# Patient Record
Sex: Male | Born: 1955 | Race: Black or African American | Hispanic: No | Marital: Married | Smoking: Current every day smoker
Health system: Southern US, Community
[De-identification: ages and names within clinical notes are randomized; demographics above are authoritative.]

## PROBLEM LIST (undated history)

## (undated) DIAGNOSIS — K759 Inflammatory liver disease, unspecified: Secondary | ICD-10-CM

## (undated) DIAGNOSIS — F329 Major depressive disorder, single episode, unspecified: Secondary | ICD-10-CM

## (undated) DIAGNOSIS — M1990 Unspecified osteoarthritis, unspecified site: Secondary | ICD-10-CM

## (undated) DIAGNOSIS — I10 Essential (primary) hypertension: Secondary | ICD-10-CM

## (undated) DIAGNOSIS — F32A Depression, unspecified: Secondary | ICD-10-CM

## (undated) HISTORY — PX: JOINT REPLACEMENT: SHX530

## (undated) HISTORY — PX: OTHER SURGICAL HISTORY: SHX169

---

## 1999-02-11 ENCOUNTER — Encounter: Payer: Self-pay | Admitting: Urology

## 1999-02-11 ENCOUNTER — Encounter: Admission: RE | Admit: 1999-02-11 | Discharge: 1999-02-11 | Payer: Self-pay | Admitting: Urology

## 1999-11-11 ENCOUNTER — Ambulatory Visit (HOSPITAL_BASED_OUTPATIENT_CLINIC_OR_DEPARTMENT_OTHER): Admission: RE | Admit: 1999-11-11 | Discharge: 1999-11-11 | Payer: Self-pay | Admitting: Urology

## 2003-02-01 ENCOUNTER — Ambulatory Visit (HOSPITAL_COMMUNITY): Admission: RE | Admit: 2003-02-01 | Discharge: 2003-02-01 | Payer: Self-pay | Admitting: Internal Medicine

## 2003-08-20 ENCOUNTER — Emergency Department (HOSPITAL_COMMUNITY): Admission: EM | Admit: 2003-08-20 | Discharge: 2003-08-20 | Payer: Self-pay | Admitting: Family Medicine

## 2003-08-22 ENCOUNTER — Emergency Department (HOSPITAL_COMMUNITY): Admission: EM | Admit: 2003-08-22 | Discharge: 2003-08-22 | Payer: Self-pay | Admitting: Family Medicine

## 2003-08-24 ENCOUNTER — Emergency Department (HOSPITAL_COMMUNITY): Admission: EM | Admit: 2003-08-24 | Discharge: 2003-08-24 | Payer: Self-pay | Admitting: Family Medicine

## 2003-09-11 ENCOUNTER — Emergency Department (HOSPITAL_COMMUNITY): Admission: EM | Admit: 2003-09-11 | Discharge: 2003-09-11 | Payer: Self-pay | Admitting: *Deleted

## 2003-11-24 ENCOUNTER — Encounter: Admission: RE | Admit: 2003-11-24 | Discharge: 2003-11-24 | Payer: Self-pay | Admitting: Family Medicine

## 2003-12-08 ENCOUNTER — Encounter: Admission: RE | Admit: 2003-12-08 | Discharge: 2003-12-08 | Payer: Self-pay | Admitting: Family Medicine

## 2004-10-29 ENCOUNTER — Emergency Department (HOSPITAL_COMMUNITY): Admission: EM | Admit: 2004-10-29 | Discharge: 2004-10-29 | Payer: Self-pay | Admitting: Family Medicine

## 2004-11-21 ENCOUNTER — Emergency Department (HOSPITAL_COMMUNITY): Admission: EM | Admit: 2004-11-21 | Discharge: 2004-11-21 | Payer: Self-pay | Admitting: Family Medicine

## 2005-02-21 ENCOUNTER — Emergency Department (HOSPITAL_COMMUNITY): Admission: EM | Admit: 2005-02-21 | Discharge: 2005-02-21 | Payer: Self-pay | Admitting: Emergency Medicine

## 2005-09-30 ENCOUNTER — Ambulatory Visit (HOSPITAL_COMMUNITY): Admission: AD | Admit: 2005-09-30 | Discharge: 2005-10-01 | Payer: Self-pay | Admitting: Surgery

## 2006-03-02 ENCOUNTER — Encounter: Admission: RE | Admit: 2006-03-02 | Discharge: 2006-03-02 | Payer: Self-pay | Admitting: Family Medicine

## 2007-02-02 ENCOUNTER — Inpatient Hospital Stay (HOSPITAL_COMMUNITY): Admission: RE | Admit: 2007-02-02 | Discharge: 2007-02-04 | Payer: Self-pay | Admitting: Orthopedic Surgery

## 2008-12-30 ENCOUNTER — Emergency Department (HOSPITAL_COMMUNITY): Admission: EM | Admit: 2008-12-30 | Discharge: 2008-12-30 | Payer: Self-pay | Admitting: Family Medicine

## 2009-12-21 ENCOUNTER — Emergency Department (HOSPITAL_COMMUNITY): Admission: EM | Admit: 2009-12-21 | Discharge: 2009-12-21 | Payer: Self-pay | Admitting: Family Medicine

## 2010-06-06 LAB — GC/CHLAMYDIA PROBE AMP, GENITAL
Chlamydia, DNA Probe: NEGATIVE
GC Probe Amp, Genital: POSITIVE — AB

## 2010-08-06 NOTE — Op Note (Signed)
NAME:  Franklin Pittman, Franklin Pittman NO.:  [id number]   MEDICAL RECORD NO.:  [id number]          PATIENT TYPE:  INP   LOCATION:  2899                         FACILITY:  [facility]   PHYSICIAN:  Lenard Galloway. Mortenson, M.D.DATE OF BIRTH:  04-Mar-1956   DATE OF PROCEDURE:  02/02/2007  DATE OF DISCHARGE:                               OPERATIVE REPORT   PREOPERATIVE DIAGNOSIS:  Severe osteoarthritis, right knee.   POSTOPERATIVE DIAGNOSIS:  Severe osteoarthritis, right knee.   OPERATION:  Total knee replacement on the right using a large right  cemented femoral component with an MBT keeled tibial tray, cemented size  5 with a large metal back patella cemented and a 12.5 poly insert large.   SURGEON:  Lenard Galloway. Chaney Malling, M.D.   ASSISTANT:  Arlys John D. Petrarca, P.A.-C.   ANESTHESIA:  General.   PROCEDURE:  The patient placed on the operative table in supine position  with the pneumatic tourniquet about the right upper thigh.  The entire  right lower extremity prepped with DuraPrep, draped out in the usual  manner.  Leg was wrapped out with an Esmarch, tourniquet was elevated.  A midline incision made starting above the patella carried down to the  tibial tubercle.  Skin edges were retracted.  Bleeders were coagulated.  A long median parapatellar incision was then made the patella was  everted.  An extensive synovectomy was then done.  Knee was flexed and  the cruciate ligaments were released and both medial and lateral  meniscus were totally excised.  There is a fair amount of varus  deformity and the medial capsule was stripped off with a Cobb.  At this  point two Schanz pins were placed in the distal femur and the proximal  tibia in a standard manner and tibial arrays and the femoral arrays were  attached.  Then computer registration started.  First the femoral head  center was registered and the tibial mechanical axis and then we defined  the proximal tibial plateau and then defined  the distal femoral condyles  and epicondyles and articular surfaces following computer promptings in  the standard manner.  Tibial planning was then done.  Tibial resection  settings were registered and then tibial resection was done following  computer promptings.  The resection made of proximal tibia.  This was  then cleaned up slightly and the confirmation guide was used and perfect  cut was made.  Soft tissue balancing was then done following computer  promptings with a tension device both flexion/extension so that the  flexion/extension gaps were essentially equal.  Once this was completed,  femoral implant planning was registered then femoral resection following  computer promptings done.  First the anterior femoral resection guide  for anterior-posterior cut was locked in place following computer  promptings and distal end of the anterior and posterior part of the  femoral condyles were amputated.  Next femoral guide resection #2 was  placed over the anterior cut section and following computer promptings,  locked in place and the distal end of femur was resected.  This was  tested with the spacer block  and there was good balancing of the medial  and lateral collateral ligaments, both in flexion and extension.  Finally, the final femoral guide was placed over the distal end of femur  locked in position with fixation pins and prepped with chamfer cuts were  made and drill holes were made.  The femoral guide was then removed.  All loose bone was removed.  The keel was placed behind the tibia and  tibia subluxed anteriorly and a small single spike was placed over  lateral side of knee.  Various size tibial components were selected for  trials and the size 5 fit very nicely.  This was locked in position with  locking pins.  The tibial tower was put in place and a hole was placed  in the proximal tibia.  The flange cutting keel was then driven down  into the tibia.  This gave excellent  stability.  The locking pins were  removed.  A trial poly was inserted and the femoral trial was then  inserted, knee put through full range of motion.  There was full  flexion, full extension.  Excellent realignment following computer  promptings.  The soft tissues were balanced very nicely in both flexion  and extension.  I was very pleased with the position using 12.5 spacer.  Next the patella cutting guide was placed in posterior aspect of the  patella.  Posterior aspect patella was amputated and then drill hole  guide was placed on the cut surface of patella and drill holes were  made.  The patellar trial was then inserted and the knee articulated and  put through full range of motion.  There was excellent flexion  extension, no rotation of the tibial plateau, no __________, AP drawer  was negative even with the capsule closed.  The patella tracked very  nicely in midline with no tilt.  At this point all the trial components  then removed.  Pulsating lavage was used to remove all debris.  Once  this accomplished to my satisfaction, glue was mixed and each of the  components were implanted first starting with the tibia and then the  femur and then the patella in the standard manner.  Excess glue was  removed in the standard manner.  Before the glue had hardened, the  tibial trial poly was inserted and the knee held in extension as the  glue cured.  Excess glue was removed using small cement removers and  curettes.  Once the glue had hardened up, the small osteotome was used  to remove extra glue.  The knee was put through full range of motion.  There was wonderful stability in range of motion.  The trial tibial poly  was removed and final tibial poly was selected and snap fitted in place.  The knee was articulated and again put through full range of motion.  Just prior to insertion of the final poly, the tourniquet was dropped  and good hemostasis was achieved.  No arterial pumping was  seen  throughout.  The knee was irrigated again and then the final tibial poly  was inserted.  The long medial parapatellar incision was closed with  interrupted Tycron sutures.  Vicryl used to close subcutaneous tissue  and stainless steel staples used to close the skin.  Technically this  procedure went extremely well.  I was very pleased with the surgical  outcome and the resulting alignment and range of motion.  Drains none.  Complications none.  ______________________________  Lenard Galloway Chaney Malling, M.D.     RAM/MEDQ  D:  02/02/2007  T:  02/03/2007  Job:  [id number]

## 2010-08-09 NOTE — Op Note (Signed)
[facility]  Patient:    Franklin Pittman, Franklin Pittman                      MRN: [id number] Proc. Date: 11/11/99 Adm. Date:  [id number] Attending:  Thermon Leyland                           Operative Report  PREOPERATIVE DIAGNOSIS:  Benign prostatic hypertrophy.  POSTOPERATIVE DIAGNOSIS:  Benign prostatic hypertrophy.  SURGEON:  Barron Alvine, M.D.  ASSISTANT:  PROCEDURE:  TUNA and TURP.  ANESTHESIA:  General.  INDICATIONS:  Mr. Hagins has had some longstanding obstructive and irritative voiding symptoms.  He has complained of considerable high density with a slow and weak stream, with some frequency and urgency.  He has had mild improvement with alpha blockers.  He was interested in a more definitive and, hopefully, more effective treatment option.  We extensively discussed the pros and cons of a TUNA procedure plus/minus a transurethral incision of his prostate.  On cystoscopy, he had a 3-cm prostatic urethra with a moderately prominent median bar and some lateral lobes.  We felt the TUNA would be good for treating the lateral lobe hyperplasia, but a transurethral incision of the bladder neck may be more effective than simply utilizing the TUNA needles in the middle lobe.  PROCEDURE IN DETAIL:  The patient was brought to the operating room where he had successful induction of general anesthesia.  He was placed in the lithotomy position, prepped and draped in the usual manner, and a TUNA procedure was done in the standard manner.  The right and left bladder necks were treated with the needle length of 12 mm.  Target temperature was reached within 30 seconds and the treatment time was at least 3-1/2 minutes on both sides.  The mid lateral lobe tissue was then treated on the right and left sides to a needle length of 14 mm.  At the completion of this a 27-French continuous flow resectoscope with a Collins knife was inserted.  The median bar was split  down to the capsular fibers, all the way back to the verumontanum.  This resulted in significant improvement of the ureteral obstruction.  At the completion of the procedure, a 24-French Foley catheter was placed and left to straight drainage.  The patient appeared to tolerate the procedure well.  There were no obvious complications. DD:  11/11/99 TD:  11/11/99 Job: [id number]

## 2010-08-09 NOTE — Op Note (Signed)
NAME:  Franklin Pittman, Franklin Pittman NO.:  [id number]   MEDICAL RECORD NO.:  [id number]          PATIENT TYPE:  AMB   LOCATION:  DAY                          [facility]ACILITY[facility]  [facility]   PHYSICIAN:  Sandria Bales. Ezzard Standing, M.D.  DATE OF BIRTH:  July 16, 1955   DATE OF PROCEDURE:  09/30/2005  DATE OF DISCHARGE:                                 OPERATIVE REPORT   PREOPERATIVE DIAGNOSIS:  Left perirectal abscess, recurrent.   POSTOPERATIVE DIAGNOSIS:  Recurrent left perirectal abscess, approximately 6-  8 cm cavity.   PROCEDURE:  Incision and drainage of left perirectal abscess.   SURGEON:  Sandria Bales. Ezzard Standing, M.D.   FIRST ASSISTANT:  None.   ANESTHESIA:  General endotracheal.   INDICATIONS FOR PROCEDURE:  Mr. Desire is a 55 year old black male who had an  incision and drainage of a perirectal abscess by Dr. Thornton Dales in about  May, 2006.  He had done well; however, since the 4th of July, about six days  ago, he has had increasing left rectal pain following a bout of diarrhea.  He presented to urgent office today, seen by Dr. Ginette Pitman.  Dr. Maryagnes Amos felt  he had a large perirectal abscess which could not easily be drained in their  office, and he referred him to the hospital for me to an incision and  drainage of this perirectal abscess.   The indications and potential complications were explained to the patient.  The potential complications include but are not limited to bleeding, (he  already has an infection), and the possibility of recurrence of the abscess.   OPERATIVE NOTE:  Patient given a general endotracheal anesthesia and laid in  the lithotomy position.  His left buttock had an inflamed area which was  maybe 8-10 cm in diameter.  He actually had two scars, one emanating from  the 1 o'clock position, the other emanating from the 4 o'clock position.  Again, this was with the patient in lithotomy with his legs up in the arm.  He had two prior scars where Dr. Zachery Dakins drained this  before.  I basically  connected these scars, making an incision which was perianal, which was  about 4-5 cm large, into a large abscessed cavity, which was under pressure.   I irrigated the wound with peroxide and saline.  I then packed it with 2  inch iodoform gauze.  I did try to put some peroxide in and look into the  anal cavity to see if I could see any fistula into the anus.  Even this  seemed to track to the left posterior rectum, I could not demonstrate a  clear fistula in ano, but you have to imagine he has some little tract that  would initiate this abscess.   I packed it with iodoform gauze.  Because it is 8;30 at night and because of  the size of the abscess, I think he is better kept overnight for observation  than to be let go home.  I will plan to reinspect him in the morning and  decide about discharge.  His final needle and sponge  count were correct.  I  did obtain aerobic and anaerobic cultures.      Sandria Bales. Ezzard Standing, M.D.  Electronically Signed     DHN/MEDQ  D:  09/30/2005  T:  09/30/2005  Job:  [id number]   cc:   L. Lupe Carney, M.D.  Fax: [phone]

## 2010-12-31 LAB — CROSSMATCH
ABO/RH(D): AB POS
Antibody Screen: NEGATIVE

## 2010-12-31 LAB — BASIC METABOLIC PANEL
CO2: 27
Calcium: 8.4
Chloride: 102
Creatinine, Ser: 1.06
GFR calc Af Amer: >60
GFR calc non Af Amer: >60
Glucose, Bld: 124 — ABNORMAL HIGH
Potassium: 3.7
Sodium: 135

## 2010-12-31 LAB — URINALYSIS, ROUTINE W REFLEX MICROSCOPIC
Bilirubin Urine: NEGATIVE
Glucose, UA: NEGATIVE
Hgb urine dipstick: NEGATIVE
Ketones, ur: NEGATIVE
Nitrite: NEGATIVE
Protein, ur: NEGATIVE
Specific Gravity, Urine: 1.03
Urobilinogen, UA: 1
pH: 6.5

## 2010-12-31 LAB — CBC
HCT: 41.6
MCHC: 33.8
MCHC: 34.1
MCHC: 34.1
MCV: 91.2
MCV: 92
Platelets: 240
RBC: 3.53 — ABNORMAL LOW
RBC: 4.53
RDW: 14
WBC: 6.2
WBC: 9.3

## 2010-12-31 LAB — COMPREHENSIVE METABOLIC PANEL
ALT: 18
AST: 19
Albumin: 4
Alkaline Phosphatase: 58
BUN: 15
CO2: 27
Calcium: 9.1
Chloride: 103
Creatinine, Ser: 1.08
GFR calc Af Amer: >60
GFR calc non Af Amer: >60
Glucose, Bld: 96
Potassium: 4.3
Sodium: 139
Total Bilirubin: 0.4
Total Protein: 6.3

## 2010-12-31 LAB — URINE CULTURE
Colony Count: NO GROWTH
Culture: NO GROWTH

## 2010-12-31 LAB — DIFFERENTIAL
Basophils Absolute: 0
Lymphocytes Relative: 40
Lymphs Abs: 2.5
Neutro Abs: 3
Neutrophils Relative %: 49

## 2010-12-31 LAB — PROTIME-INR
INR: 1
INR: 1.6 — ABNORMAL HIGH
Prothrombin Time: 19.1 — ABNORMAL HIGH

## 2010-12-31 LAB — APTT: aPTT: 59 — ABNORMAL HIGH

## 2012-02-20 ENCOUNTER — Emergency Department (HOSPITAL_COMMUNITY)
Admission: EM | Admit: 2012-02-20 | Discharge: 2012-02-20 | Disposition: A | Discharge status: Home / Self Care | Payer: BC Managed Care – PPO | Attending: Emergency Medicine | Admitting: Emergency Medicine

## 2012-02-20 ENCOUNTER — Encounter (HOSPITAL_COMMUNITY): Payer: Self-pay | Admitting: *Deleted

## 2012-02-20 DIAGNOSIS — S61419A Laceration without foreign body of unspecified hand, initial encounter: Secondary | ICD-10-CM

## 2012-02-20 DIAGNOSIS — S61409A Unspecified open wound of unspecified hand, initial encounter: Secondary | ICD-10-CM | POA: Insufficient documentation

## 2012-02-20 DIAGNOSIS — Y939 Activity, unspecified: Secondary | ICD-10-CM | POA: Insufficient documentation

## 2012-02-20 DIAGNOSIS — F17200 Nicotine dependence, unspecified, uncomplicated: Secondary | ICD-10-CM | POA: Insufficient documentation

## 2012-02-20 DIAGNOSIS — Y929 Unspecified place or not applicable: Secondary | ICD-10-CM | POA: Insufficient documentation

## 2012-02-20 DIAGNOSIS — X58XXXA Exposure to other specified factors, initial encounter: Secondary | ICD-10-CM | POA: Insufficient documentation

## 2012-02-20 DIAGNOSIS — Z23 Encounter for immunization: Secondary | ICD-10-CM | POA: Insufficient documentation

## 2012-02-20 MED ORDER — TETANUS-DIPHTH-ACELL PERTUSSIS 5-2.5-18.5 LF-MCG/0.5 IM SUSP
0.5000 mL | Freq: Once | INTRAMUSCULAR | Status: AC
  Administered 2012-02-20: 0.5 mL via INTRAMUSCULAR
  Filled 2012-02-20: qty 0.5

## 2012-02-20 NOTE — ED Notes (Signed)
Lac rt hand yesterday on barbed wire.  No bleeding.  The wound is  Open.  No pain

## 2012-02-20 NOTE — ED Provider Notes (Signed)
History   This chart was scribed for Benny Lennert, MD by Sofie Rower, ED Scribe. The patient was seen in room TR09C/TR09C and the patient's care was started at 7:26PM.    CSN: [id number]  Arrival date & time 02/20/12  1750   First MD Initiated Contact with Patient 02/20/12 1926      Chief Complaint  Patient presents with  . Laceration    (Consider location/radiation/quality/duration/timing/severity/associated sxs/prior treatment) Patient is a 56 y.o. male presenting with skin laceration. The history is provided by the patient. No language interpreter was used.  Laceration  The incident occurred 12 to 24 hours ago. Pain location: Left hand. The laceration is 3 cm in size. The laceration mechanism is unknown.The pain is moderate. The pain has been constant since onset. He reports no foreign bodies present. His tetanus status is unknown.    History reviewed. No pertinent past medical history.  History reviewed. No pertinent past surgical history.  No family history on file.  History  Substance Use Topics  . Smoking status: Current Every Day Smoker  . Smokeless tobacco: Not on file  . Alcohol Use: Yes      Review of Systems  Constitutional: Negative for fatigue.  HENT: Negative for congestion, sinus pressure and ear discharge.   Eyes: Negative for discharge.  Respiratory: Negative for cough.   Cardiovascular: Negative for chest pain.  Gastrointestinal: Negative for abdominal pain and diarrhea.  Genitourinary: Negative for frequency and hematuria.  Musculoskeletal: Negative for back pain.  Skin: Negative for rash.  Neurological: Negative for seizures and headaches.  Hematological: Negative.   Psychiatric/Behavioral: Negative for hallucinations.    Allergies  Review of patient's allergies indicates not on file.  Home Medications   Current Outpatient Rx  Name  Route  Sig  Dispense  Refill  . IBUPROFEN 200 MG PO TABS   Oral   Take 400 mg by mouth every 6 (six)  hours as needed. For pain           BP 137/84  Pulse 80  Temp 98.1 F (36.7 C) (Oral)  Resp 16  Ht 5\' 11"  (1.803 m)  Wt 196 lb (88.905 kg)  BMI 27.34 kg/m2  SpO2 98%  Physical Exam  Nursing note and vitals reviewed. Constitutional: He is oriented to person, place, and time. He appears well-developed.  HENT:  Head: Normocephalic.  Eyes: Conjunctivae normal are normal.  Neck: No tracheal deviation present.  Cardiovascular:  No murmur heard. Musculoskeletal: Normal range of motion.       Left hand: He exhibits laceration (3 cm).       Hands: Neurological: He is oriented to person, place, and time.  Skin: Skin is warm.  Psychiatric: He has a normal mood and affect.    ED Course  Procedures (including critical care time)  DIAGNOSTIC STUDIES: Oxygen Saturation is 98% on room air, normal by my interpretation.    COORDINATION OF CARE:   7:29 PM- Treatment plan discussed with patient. Pt agrees with treatment.     Labs Reviewed - No data to display No results found.   No diagnosis found.    MDM       The chart was scribed for me under my direct supervision.  I personally performed the history, physical, and medical decision making and all procedures in the evaluation of this patient.Benny Lennert, MD 02/20/12 [phone]

## 2012-02-20 NOTE — ED Notes (Signed)
Pt. Verbalized understanding of wound care.

## 2012-02-20 NOTE — ED Notes (Signed)
Pt. States "I cut my hand on some wire". Laceration to left hand, not bleeding. Pt. Reports happened yesterday. Tetanus not up-to-date.

## 2012-03-24 DIAGNOSIS — K759 Inflammatory liver disease, unspecified: Secondary | ICD-10-CM

## 2012-03-24 HISTORY — DX: Inflammatory liver disease, unspecified: K75.9

## 2015-03-02 DIAGNOSIS — Z0001 Encounter for general adult medical examination with abnormal findings: Secondary | ICD-10-CM | POA: Diagnosis not present

## 2015-03-02 DIAGNOSIS — E7800 Pure hypercholesterolemia, unspecified: Secondary | ICD-10-CM | POA: Diagnosis not present

## 2015-03-02 DIAGNOSIS — B009 Herpesviral infection, unspecified: Secondary | ICD-10-CM | POA: Diagnosis not present

## 2015-03-02 DIAGNOSIS — H6990 Unspecified Eustachian tube disorder, unspecified ear: Secondary | ICD-10-CM | POA: Diagnosis not present

## 2015-03-02 DIAGNOSIS — Z125 Encounter for screening for malignant neoplasm of prostate: Secondary | ICD-10-CM | POA: Diagnosis not present

## 2015-03-02 DIAGNOSIS — L600 Ingrowing nail: Secondary | ICD-10-CM | POA: Diagnosis not present

## 2015-03-02 DIAGNOSIS — M1990 Unspecified osteoarthritis, unspecified site: Secondary | ICD-10-CM | POA: Diagnosis not present

## 2015-03-02 DIAGNOSIS — N529 Male erectile dysfunction, unspecified: Secondary | ICD-10-CM | POA: Diagnosis not present

## 2016-02-06 DIAGNOSIS — M79671 Pain in right foot: Secondary | ICD-10-CM | POA: Diagnosis not present

## 2016-02-06 DIAGNOSIS — L110 Acquired keratosis follicularis: Secondary | ICD-10-CM | POA: Diagnosis not present

## 2016-02-06 DIAGNOSIS — M2041 Other hammer toe(s) (acquired), right foot: Secondary | ICD-10-CM | POA: Diagnosis not present

## 2016-02-06 DIAGNOSIS — M2011 Hallux valgus (acquired), right foot: Secondary | ICD-10-CM | POA: Diagnosis not present

## 2016-03-03 DIAGNOSIS — E7800 Pure hypercholesterolemia, unspecified: Secondary | ICD-10-CM | POA: Diagnosis not present

## 2016-03-03 DIAGNOSIS — Z0000 Encounter for general adult medical examination without abnormal findings: Secondary | ICD-10-CM | POA: Diagnosis not present

## 2016-03-03 DIAGNOSIS — M1990 Unspecified osteoarthritis, unspecified site: Secondary | ICD-10-CM | POA: Diagnosis not present

## 2016-03-03 DIAGNOSIS — Z125 Encounter for screening for malignant neoplasm of prostate: Secondary | ICD-10-CM | POA: Diagnosis not present

## 2016-03-03 DIAGNOSIS — Z23 Encounter for immunization: Secondary | ICD-10-CM | POA: Diagnosis not present

## 2016-03-03 DIAGNOSIS — Z1211 Encounter for screening for malignant neoplasm of colon: Secondary | ICD-10-CM | POA: Diagnosis not present

## 2016-09-09 DIAGNOSIS — M11262 Other chondrocalcinosis, left knee: Secondary | ICD-10-CM | POA: Diagnosis not present

## 2016-09-09 DIAGNOSIS — M25562 Pain in left knee: Secondary | ICD-10-CM | POA: Diagnosis not present

## 2016-09-09 DIAGNOSIS — G8929 Other chronic pain: Secondary | ICD-10-CM | POA: Diagnosis not present

## 2016-09-09 DIAGNOSIS — Q741 Congenital malformation of knee: Secondary | ICD-10-CM | POA: Diagnosis not present

## 2016-09-09 DIAGNOSIS — M25462 Effusion, left knee: Secondary | ICD-10-CM | POA: Diagnosis not present

## 2016-09-09 DIAGNOSIS — M2342 Loose body in knee, left knee: Secondary | ICD-10-CM | POA: Diagnosis not present

## 2016-09-09 DIAGNOSIS — M1712 Unilateral primary osteoarthritis, left knee: Secondary | ICD-10-CM | POA: Diagnosis not present

## 2016-09-09 DIAGNOSIS — M222X2 Patellofemoral disorders, left knee: Secondary | ICD-10-CM | POA: Diagnosis not present

## 2016-09-16 ENCOUNTER — Other Ambulatory Visit: Payer: Self-pay | Admitting: Orthopedic Surgery

## 2016-10-15 NOTE — Pre-Procedure Instructions (Signed)
Franklin AlexanderWilliam A Pittman  10/15/2016      [facility] [id number] [place] - [address] [facility] [address] [address] [place] [id number] Phone: [phone] Fax: [phone]    Your procedure is scheduled on August 6  [facility] Admitting at [employer] A.M.  Call this number if you have problems the morning of surgery:  [phone]   Remember:  Do not eat food or drink liquids after midnight.   Take these medicines the morning of surgery with A SIP OF WATER NONE  7 days prior to surgery STOP taking any Aspirin, Aleve, Naproxen, Ibuprofen, Motrin, Advil, Goody's, BC's, all herbal medications, fish oil, and all vitamins    Do not wear jewelry  Do not wear lotions, powders, or cologne, or deoderant.  Men may shave face and neck.  Do not bring valuables to the hospital.  [facility] is not responsible for any belongings or valuables.  Contacts, dentures or bridgework may not be worn into surgery.  Leave your suitcase in the car.  After surgery it may be brought to your room.  For patients admitted to the hospital, discharge time will be determined by your treatment team.  Patients discharged the day of surgery will not be allowed to drive home.    Special instructions:   - Preparing For Surgery  Before surgery, you can play an important role. Because skin is not sterile, your skin needs to be as free of germs as possible. You can reduce the number of germs on your skin by washing with CHG (chlorahexidine gluconate) Soap before surgery.  CHG is an antiseptic cleaner which kills germs and bonds with the skin to continue killing germs even after washing.  Please do not use if you have an allergy to CHG or antibacterial soaps. If your skin becomes reddened/irritated stop using the CHG.  Do not shave (including legs and underarms) for at least 48 hours prior to first CHG shower. It is OK to shave your  face.  Please follow these instructions carefully.   1. Shower the NIGHT BEFORE SURGERY and the MORNING OF SURGERY with CHG.   2. If you chose to wash your hair, wash your hair first as usual with your normal shampoo.  3. After you shampoo, rinse your hair and body thoroughly to remove the shampoo.  4. Use CHG as you would any other liquid soap. You can apply CHG directly to the skin and wash gently with a scrungie or a clean washcloth.   5. Apply the CHG Soap to your body ONLY FROM THE NECK DOWN.  Do not use on open wounds or open sores. Avoid contact with your eyes, ears, mouth and genitals (private parts). Wash genitals (private parts) with your normal soap.  6. Wash thoroughly, paying special attention to the area where your surgery will be performed.  7. Thoroughly rinse your body with warm water from the neck down.  8. DO NOT shower/wash with your normal soap after using and rinsing off the CHG Soap.  9. Pat yourself dry with a CLEAN TOWEL.   10. Wear CLEAN PAJAMAS   11. Place CLEAN SHEETS on your bed the night of your first shower and DO NOT SLEEP WITH PETS.    Day of Surgery: Do not apply any deodorants/lotions. Please wear clean clothes to the hospital/surgery center.      Please read over the following fact  sheets that you were given.

## 2016-10-16 ENCOUNTER — Encounter (HOSPITAL_COMMUNITY)
Admission: RE | Admit: 2016-10-16 | Discharge: 2016-10-16 | Disposition: A | Discharge status: Home / Self Care | Payer: Medicare HMO | Source: Ambulatory Visit | Attending: Orthopedic Surgery | Admitting: Orthopedic Surgery

## 2016-10-16 ENCOUNTER — Encounter (HOSPITAL_COMMUNITY): Payer: Self-pay

## 2016-10-16 DIAGNOSIS — M1712 Unilateral primary osteoarthritis, left knee: Secondary | ICD-10-CM | POA: Diagnosis not present

## 2016-10-16 DIAGNOSIS — Z01812 Encounter for preprocedural laboratory examination: Secondary | ICD-10-CM | POA: Insufficient documentation

## 2016-10-16 HISTORY — DX: Inflammatory liver disease, unspecified: K75.9

## 2016-10-16 HISTORY — DX: Major depressive disorder, single episode, unspecified: F32.9

## 2016-10-16 HISTORY — DX: Depression, unspecified: F32.A

## 2016-10-16 LAB — CBC WITH DIFFERENTIAL/PLATELET
Basophils Absolute: 0 10*3/uL (ref 0.0–0.1)
Basophils Relative: 0 %
EOS ABS: 0.2 10*3/uL (ref 0.0–0.7)
Eosinophils Relative: 2 %
HCT: 43.6 % (ref 39.0–52.0)
HEMOGLOBIN: 14.8 g/dL (ref 13.0–17.0)
LYMPHS ABS: 3.1 10*3/uL (ref 0.7–4.0)
Lymphocytes Relative: 47 %
MCH: 30.5 pg (ref 26.0–34.0)
MCHC: 33.9 g/dL (ref 30.0–36.0)
MCV: 89.7 fL (ref 78.0–100.0)
MONO ABS: 0.5 10*3/uL (ref 0.1–1.0)
Monocytes Relative: 7 %
Neutro Abs: 2.9 10*3/uL (ref 1.7–7.7)
Neutrophils Relative %: 44 %
PLATELETS: 271 10*3/uL (ref 150–400)
RBC: 4.86 MIL/uL (ref 4.22–5.81)
RDW: 13.7 % (ref 11.5–15.5)
WBC: 6.6 10*3/uL (ref 4.0–10.5)

## 2016-10-16 LAB — COMPREHENSIVE METABOLIC PANEL
ALT: 15 U/L — ABNORMAL LOW (ref 17–63)
ANION GAP: 6 (ref 5–15)
AST: 20 U/L (ref 15–41)
Albumin: 4 g/dL (ref 3.5–5.0)
Alkaline Phosphatase: 72 U/L (ref 38–126)
BUN: 18 mg/dL (ref 6–20)
CHLORIDE: 101 mmol/L (ref 101–111)
CO2: 29 mmol/L (ref 22–32)
Calcium: 8.9 mg/dL (ref 8.9–10.3)
Creatinine, Ser: 1.4 mg/dL — ABNORMAL HIGH (ref 0.61–1.24)
GFR calc non Af Amer: 52 mL/min — ABNORMAL LOW (ref >60)
Glucose, Bld: 72 mg/dL (ref 65–99)
Potassium: 4 mmol/L (ref 3.5–5.1)
SODIUM: 136 mmol/L (ref 135–145)
Total Bilirubin: 0.5 mg/dL (ref 0.3–1.2)
Total Protein: 6.3 g/dL — ABNORMAL LOW (ref 6.5–8.1)

## 2016-10-16 LAB — SURGICAL PCR SCREEN
MRSA, PCR: NEGATIVE
STAPHYLOCOCCUS AUREUS: NEGATIVE

## 2016-10-16 NOTE — Progress Notes (Signed)
PCP: Dr. Lupe Carneyean Mitchell @ [facility] in [place], [place]

## 2016-10-24 MED ORDER — TRANEXAMIC ACID 1000 MG/10ML IV SOLN
1000.0000 mg | INTRAVENOUS | Status: AC
  Administered 2016-10-27: 1000 mg via INTRAVENOUS
  Filled 2016-10-24: qty 10

## 2016-10-26 NOTE — H&P (Signed)
Franklin Pittman MRN:  [id number] DOB/SEX:  11/23/1955/male  CHIEF COMPLAINT:  Painful left Knee  HISTORY: Patient is a 61 y.o. male presented with a history of pain in the left knee. Onset of symptoms was gradual starting a few years ago with gradually worsening course since that time. Patient has been treated conservatively with over-the-counter NSAIDs and activity modification. Patient currently rates pain in the knee at 10 out of 10 with activity. There is pain at night.  PAST MEDICAL HISTORY: There are no active problems to display for this patient.  Past Medical History:  Diagnosis Date  . Depression    history of  . Hepatitis 2014   "c"- took pills    Past Surgical History:  Procedure Laterality Date  . boil removed on perineum    . JOINT REPLACEMENT Right    16 yrs. ago     MEDICATIONS:   No prescriptions prior to admission.    ALLERGIES:  No Known Allergies  REVIEW OF SYSTEMS:  A comprehensive review of systems was negative except for: Musculoskeletal: positive for arthralgias and bone pain   FAMILY HISTORY:  No family history on file.  SOCIAL HISTORY:   Social History  Substance Use Topics  . Smoking status: Current Every Day Smoker    Packs/day: 0.50    Years: 40.00    Types: Cigarettes  . Smokeless tobacco: Never Used  . Alcohol use Yes     Comment: occasionally     EXAMINATION:  Vital signs in last 24 hours:    There were no vitals taken for this visit.  General Appearance:    Alert, cooperative, no distress, appears stated age  Head:    Normocephalic, without obvious abnormality, atraumatic  Eyes:    PERRL, conjunctiva/corneas clear, EOM's intact, fundi    benign, both eyes       Ears:    Normal TM's and external ear canals, both ears  Nose:   Nares normal, septum midline, mucosa normal, no drainage    or sinus tenderness  Throat:   Lips, mucosa, and tongue normal; teeth and gums normal  Neck:   Supple, symmetrical, trachea midline, no  adenopathy;       thyroid:  No enlargement/tenderness/nodules; no carotid   bruit or JVD  Back:     Symmetric, no curvature, ROM normal, no CVA tenderness  Lungs:     Clear to auscultation bilaterally, respirations unlabored  Chest wall:    No tenderness or deformity  Heart:    Regular rate and rhythm, S1 and S2 normal, no murmur, rub   or gallop  Abdomen:     Soft, non-tender, bowel sounds active all four quadrants,    no masses, no organomegaly  Genitalia:    Normal male without lesion, discharge or tenderness  Rectal:    Normal tone, normal prostate, no masses or tenderness;   guaiac negative stool  Extremities:   Extremities normal, atraumatic, no cyanosis or edema  Pulses:   2+ and symmetric all extremities  Skin:   Skin color, texture, turgor normal, no rashes or lesions  Lymph nodes:   Cervical, supraclavicular, and axillary nodes normal  Neurologic:   CNII-XII intact. Normal strength, sensation and reflexes      throughout    Musculoskeletal:  ROM 0-120, Ligaments intact,  Imaging Review Plain radiographs demonstrate severe degenerative joint disease of the left knee. The overall alignment is neutral. The bone quality appears to be excellent for age and reported activity level.  Assessment/Plan: Primary osteoarthritis, left knee   The patient history, physical examination and imaging studies are consistent with advanced degenerative joint disease of the left knee. The patient has failed conservative treatment.  The clearance notes were reviewed.  After discussion with the patient it was felt that Total Knee Replacement was indicated. The procedure,  risks, and benefits of total knee arthroplasty were presented and reviewed. The risks including but not limited to aseptic loosening, infection, blood clots, vascular injury, stiffness, patella tracking problems complications among others were discussed. The patient acknowledged the explanation, agreed to proceed with the plan.  [name]  [name] 10/26/2016, 9:16 PM

## 2016-10-27 ENCOUNTER — Ambulatory Visit (HOSPITAL_COMMUNITY): Payer: Medicare HMO | Admitting: Anesthesiology

## 2016-10-27 ENCOUNTER — Observation Stay (HOSPITAL_COMMUNITY)
Admission: RE | Admit: 2016-10-27 | Discharge: 2016-10-28 | Disposition: A | Discharge status: Home / Self Care | Payer: Medicare HMO | Source: Ambulatory Visit | Attending: Orthopedic Surgery | Admitting: Orthopedic Surgery

## 2016-10-27 ENCOUNTER — Encounter (HOSPITAL_COMMUNITY)
Admission: RE | Disposition: A | Discharge status: Home / Self Care | Payer: Self-pay | Source: Ambulatory Visit | Attending: Orthopedic Surgery

## 2016-10-27 ENCOUNTER — Encounter (HOSPITAL_COMMUNITY): Payer: Self-pay | Admitting: Certified Registered"

## 2016-10-27 DIAGNOSIS — G8918 Other acute postprocedural pain: Secondary | ICD-10-CM | POA: Diagnosis not present

## 2016-10-27 DIAGNOSIS — M1712 Unilateral primary osteoarthritis, left knee: Principal | ICD-10-CM | POA: Insufficient documentation

## 2016-10-27 DIAGNOSIS — F17210 Nicotine dependence, cigarettes, uncomplicated: Secondary | ICD-10-CM | POA: Insufficient documentation

## 2016-10-27 DIAGNOSIS — Z96659 Presence of unspecified artificial knee joint: Secondary | ICD-10-CM

## 2016-10-27 HISTORY — DX: Unspecified osteoarthritis, unspecified site: M19.90

## 2016-10-27 HISTORY — PX: TOTAL KNEE ARTHROPLASTY: SHX125

## 2016-10-27 SURGERY — ARTHROPLASTY, KNEE, TOTAL
Anesthesia: Regional | Site: Knee | Laterality: Left

## 2016-10-27 MED ORDER — ONDANSETRON HCL 4 MG PO TABS: 4.0000 mg | ORAL_TABLET | Freq: Four times a day (QID) | ORAL | Status: DC | PRN

## 2016-10-27 MED ORDER — CELECOXIB 200 MG PO CAPS
200.0000 mg | ORAL_CAPSULE | Freq: Two times a day (BID) | ORAL | Status: DC
  Administered 2016-10-27 – 2016-10-28 (×3): 200 mg via ORAL
  Filled 2016-10-27 (×3): qty 1

## 2016-10-27 MED ORDER — GABAPENTIN 300 MG PO CAPS
300.0000 mg | ORAL_CAPSULE | Freq: Once | ORAL | Status: AC
  Administered 2016-10-27: 300 mg via ORAL
  Filled 2016-10-27: qty 1

## 2016-10-27 MED ORDER — FENTANYL CITRATE (PF) 250 MCG/5ML IJ SOLN
INTRAMUSCULAR | Status: DC | PRN
  Administered 2016-10-27: 50 ug via INTRAVENOUS

## 2016-10-27 MED ORDER — MENTHOL 3 MG MT LOZG: 1.0000 | LOZENGE | OROMUCOSAL | Status: DC | PRN

## 2016-10-27 MED ORDER — ZOLPIDEM TARTRATE 5 MG PO TABS: 5.0000 mg | ORAL_TABLET | Freq: Every evening | ORAL | Status: DC | PRN

## 2016-10-27 MED ORDER — METOCLOPRAMIDE HCL 5 MG PO TABS: 5.0000 mg | ORAL_TABLET | Freq: Three times a day (TID) | ORAL | Status: DC | PRN

## 2016-10-27 MED ORDER — CEFAZOLIN SODIUM-DEXTROSE 1-4 GM/50ML-% IV SOLN
1.0000 g | Freq: Four times a day (QID) | INTRAVENOUS | Status: AC
  Administered 2016-10-27 (×2): 1 g via INTRAVENOUS
  Filled 2016-10-27 (×2): qty 50

## 2016-10-27 MED ORDER — BUPIVACAINE LIPOSOME 1.3 % IJ SUSP
20.0000 mL | INTRAMUSCULAR | Status: AC
  Administered 2016-10-27: 20 mL
  Filled 2016-10-27: qty 20

## 2016-10-27 MED ORDER — BISACODYL 5 MG PO TBEC: 5.0000 mg | DELAYED_RELEASE_TABLET | Freq: Every day | ORAL | Status: DC | PRN

## 2016-10-27 MED ORDER — DOCUSATE SODIUM 100 MG PO CAPS
100.0000 mg | ORAL_CAPSULE | Freq: Two times a day (BID) | ORAL | Status: DC
  Administered 2016-10-27 – 2016-10-28 (×3): 100 mg via ORAL
  Filled 2016-10-27 (×3): qty 1

## 2016-10-27 MED ORDER — HYDROMORPHONE HCL 1 MG/ML IJ SOLN: 1.0000 mg | INTRAMUSCULAR | Status: DC | PRN

## 2016-10-27 MED ORDER — BUPIVACAINE-EPINEPHRINE (PF) 0.25% -1:200000 IJ SOLN
INTRAMUSCULAR | Status: DC | PRN
  Administered 2016-10-27: 30 mL

## 2016-10-27 MED ORDER — ACETAMINOPHEN 650 MG RE SUPP: 650.0000 mg | Freq: Four times a day (QID) | RECTAL | Status: DC | PRN

## 2016-10-27 MED ORDER — ACETAMINOPHEN 500 MG PO TABS
1000.0000 mg | ORAL_TABLET | Freq: Once | ORAL | Status: AC
  Administered 2016-10-27: 1000 mg via ORAL
  Filled 2016-10-27: qty 2

## 2016-10-27 MED ORDER — DEXAMETHASONE SODIUM PHOSPHATE 10 MG/ML IJ SOLN
10.0000 mg | Freq: Once | INTRAMUSCULAR | Status: AC
  Administered 2016-10-28: 10 mg via INTRAVENOUS
  Filled 2016-10-27: qty 1

## 2016-10-27 MED ORDER — SODIUM CHLORIDE 0.9 % IV SOLN
1000.0000 mg | Freq: Once | INTRAVENOUS | Status: AC
  Administered 2016-10-27: 1000 mg via INTRAVENOUS
  Filled 2016-10-27: qty 10

## 2016-10-27 MED ORDER — METHOCARBAMOL 1000 MG/10ML IJ SOLN
500.0000 mg | Freq: Four times a day (QID) | INTRAVENOUS | Status: DC | PRN
  Filled 2016-10-27: qty 5

## 2016-10-27 MED ORDER — FENTANYL CITRATE (PF) 250 MCG/5ML IJ SOLN
INTRAMUSCULAR | Status: AC
  Filled 2016-10-27: qty 5

## 2016-10-27 MED ORDER — FENTANYL CITRATE (PF) 100 MCG/2ML IJ SOLN
INTRAMUSCULAR | Status: DC
Start: 2016-10-27 — End: 2016-10-27
  Filled 2016-10-27: qty 2

## 2016-10-27 MED ORDER — ONDANSETRON HCL 4 MG/2ML IJ SOLN: 4.0000 mg | Freq: Four times a day (QID) | INTRAMUSCULAR | Status: DC | PRN

## 2016-10-27 MED ORDER — METOCLOPRAMIDE HCL 5 MG/ML IJ SOLN: 5.0000 mg | Freq: Three times a day (TID) | INTRAMUSCULAR | Status: DC | PRN

## 2016-10-27 MED ORDER — ACETAMINOPHEN 325 MG PO TABS: 650.0000 mg | ORAL_TABLET | Freq: Four times a day (QID) | ORAL | Status: DC | PRN

## 2016-10-27 MED ORDER — SENNOSIDES-DOCUSATE SODIUM 8.6-50 MG PO TABS: 1.0000 | ORAL_TABLET | Freq: Every evening | ORAL | Status: DC | PRN

## 2016-10-27 MED ORDER — PHENOL 1.4 % MT LIQD: 1.0000 | OROMUCOSAL | Status: DC | PRN

## 2016-10-27 MED ORDER — ASPIRIN EC 325 MG PO TBEC
325.0000 mg | DELAYED_RELEASE_TABLET | Freq: Two times a day (BID) | ORAL | Status: DC
  Administered 2016-10-27 – 2016-10-28 (×3): 325 mg via ORAL
  Filled 2016-10-27 (×3): qty 1

## 2016-10-27 MED ORDER — PROPOFOL 10 MG/ML IV BOLUS
INTRAVENOUS | Status: AC
  Filled 2016-10-27: qty 20

## 2016-10-27 MED ORDER — OXYCODONE HCL 5 MG PO TABS
5.0000 mg | ORAL_TABLET | ORAL | Status: DC | PRN
  Administered 2016-10-27 – 2016-10-28 (×2): 10 mg via ORAL
  Filled 2016-10-27 (×2): qty 2

## 2016-10-27 MED ORDER — PROPOFOL 500 MG/50ML IV EMUL
INTRAVENOUS | Status: DC | PRN
  Administered 2016-10-27: 100 ug/kg/min via INTRAVENOUS

## 2016-10-27 MED ORDER — ROPIVACAINE HCL 5 MG/ML IJ SOLN
INTRAMUSCULAR | Status: DC | PRN
  Administered 2016-10-27: 30 mL via PERINEURAL

## 2016-10-27 MED ORDER — DEXAMETHASONE SODIUM PHOSPHATE 10 MG/ML IJ SOLN
8.0000 mg | Freq: Once | INTRAMUSCULAR | Status: AC
  Administered 2016-10-27: 8 mg via INTRAVENOUS
  Filled 2016-10-27: qty 1

## 2016-10-27 MED ORDER — HYDROCODONE-ACETAMINOPHEN 7.5-325 MG PO TABS
1.0000 | ORAL_TABLET | Freq: Four times a day (QID) | ORAL | Status: DC
  Administered 2016-10-27 – 2016-10-28 (×4): 1 via ORAL
  Filled 2016-10-27 (×4): qty 1

## 2016-10-27 MED ORDER — LACTATED RINGERS IV SOLN
INTRAVENOUS | Status: DC
  Administered 2016-10-27: 50 mL/h via INTRAVENOUS

## 2016-10-27 MED ORDER — ALUM & MAG HYDROXIDE-SIMETH 200-200-20 MG/5ML PO SUSP: 30.0000 mL | ORAL | Status: DC | PRN

## 2016-10-27 MED ORDER — METHOCARBAMOL 500 MG PO TABS
500.0000 mg | ORAL_TABLET | Freq: Four times a day (QID) | ORAL | Status: DC | PRN
  Administered 2016-10-27 – 2016-10-28 (×2): 500 mg via ORAL
  Filled 2016-10-27 (×2): qty 1

## 2016-10-27 MED ORDER — BUPIVACAINE IN DEXTROSE 0.75-8.25 % IT SOLN
INTRATHECAL | Status: DC | PRN
  Administered 2016-10-27: 2 mL via INTRATHECAL

## 2016-10-27 MED ORDER — CEFAZOLIN SODIUM-DEXTROSE 2-4 GM/100ML-% IV SOLN
2.0000 g | INTRAVENOUS | Status: AC
  Administered 2016-10-27: 2 g via INTRAVENOUS
  Filled 2016-10-27: qty 100

## 2016-10-27 MED ORDER — ONDANSETRON HCL 4 MG/2ML IJ SOLN: 4.0000 mg | Freq: Once | INTRAMUSCULAR | Status: DC | PRN

## 2016-10-27 MED ORDER — CHLORHEXIDINE GLUCONATE 4 % EX LIQD: 60.0000 mL | Freq: Once | CUTANEOUS | Status: DC

## 2016-10-27 MED ORDER — DIPHENHYDRAMINE HCL 12.5 MG/5ML PO ELIX: 12.5000 mg | ORAL_SOLUTION | ORAL | Status: DC | PRN

## 2016-10-27 MED ORDER — FENTANYL CITRATE (PF) 100 MCG/2ML IJ SOLN: 25.0000 ug | INTRAMUSCULAR | Status: DC | PRN

## 2016-10-27 MED ORDER — SODIUM CHLORIDE 0.9 % IR SOLN
Status: DC | PRN
  Administered 2016-10-27: 3000 mL

## 2016-10-27 MED ORDER — MIDAZOLAM HCL 2 MG/2ML IJ SOLN
INTRAMUSCULAR | Status: AC
  Filled 2016-10-27: qty 2

## 2016-10-27 MED ORDER — FLEET ENEMA 7-19 GM/118ML RE ENEM: 1.0000 | ENEMA | Freq: Once | RECTAL | Status: DC | PRN

## 2016-10-27 MED ORDER — MIDAZOLAM HCL 2 MG/2ML IJ SOLN
2.0000 mg | Freq: Once | INTRAMUSCULAR | Status: AC
  Administered 2016-10-27: 2 mg via INTRAVENOUS

## 2016-10-27 MED ORDER — GABAPENTIN 300 MG PO CAPS
300.0000 mg | ORAL_CAPSULE | Freq: Three times a day (TID) | ORAL | Status: DC
  Administered 2016-10-27 – 2016-10-28 (×3): 300 mg via ORAL
  Filled 2016-10-27 (×3): qty 1

## 2016-10-27 MED ORDER — SODIUM CHLORIDE 0.9 % IJ SOLN
INTRAMUSCULAR | Status: DC | PRN
  Administered 2016-10-27: 20 mL

## 2016-10-27 MED ORDER — MIDAZOLAM HCL 2 MG/2ML IJ SOLN
INTRAMUSCULAR | Status: AC
  Administered 2016-10-27: 2 mg via INTRAVENOUS
  Filled 2016-10-27: qty 2

## 2016-10-27 MED ORDER — MIDAZOLAM HCL 5 MG/5ML IJ SOLN
INTRAMUSCULAR | Status: DC | PRN
  Administered 2016-10-27: 2 mg via INTRAVENOUS

## 2016-10-27 SURGICAL SUPPLY — 62 items
BANDAGE ACE 6X5 VEL STRL LF (GAUZE/BANDAGES/DRESSINGS) ×3 IMPLANT
BANDAGE ESMARK 6X9 LF (GAUZE/BANDAGES/DRESSINGS) ×1 IMPLANT
BLADE SAGITTAL 13X1.27X60 (BLADE) ×2 IMPLANT
BLADE SAGITTAL 13X1.27X60MM (BLADE) ×1
BLADE SAW SGTL 83.5X18.5 (BLADE) ×3 IMPLANT
BLADE SURG 10 STRL SS (BLADE) ×3 IMPLANT
BNDG ESMARK 6X9 LF (GAUZE/BANDAGES/DRESSINGS) ×3
BOWL SMART MIX CTS (DISPOSABLE) ×3 IMPLANT
CAPT KNEE TOTAL 3 ×3 IMPLANT
CEMENT BONE SIMPLEX SPEEDSET (Cement) ×6 IMPLANT
CLOSURE STERI-STRIP 1/2X4 (GAUZE/BANDAGES/DRESSINGS) ×1
CLOSURE WOUND 1/2 X4 (GAUZE/BANDAGES/DRESSINGS) ×1
CLSR STERI-STRIP ANTIMIC 1/2X4 (GAUZE/BANDAGES/DRESSINGS) ×2 IMPLANT
COVER SURGICAL LIGHT HANDLE (MISCELLANEOUS) ×3 IMPLANT
CUFF TOURNIQUET SINGLE 34IN LL (TOURNIQUET CUFF) ×3 IMPLANT
DRAPE EXTREMITY T 121X128X90 (DRAPE) ×3 IMPLANT
DRAPE HALF SHEET 40X57 (DRAPES) ×3 IMPLANT
DRAPE INCISE IOBAN 66X45 STRL (DRAPES) ×6 IMPLANT
DRAPE U-SHAPE 47X51 STRL (DRAPES) ×3 IMPLANT
DRSG AQUACEL AG ADV 3.5X10 (GAUZE/BANDAGES/DRESSINGS) ×3 IMPLANT
DURAPREP 26ML APPLICATOR (WOUND CARE) ×6 IMPLANT
ELECT REM PT RETURN 9FT ADLT (ELECTROSURGICAL) ×3
ELECTRODE REM PT RTRN 9FT ADLT (ELECTROSURGICAL) ×1 IMPLANT
FILTER STRAW FLUID ASPIR (MISCELLANEOUS) IMPLANT
GLOVE BIOGEL M 7.0 STRL (GLOVE) IMPLANT
GLOVE BIOGEL PI IND STRL 7.5 (GLOVE) IMPLANT
GLOVE BIOGEL PI IND STRL 8.5 (GLOVE) ×1 IMPLANT
GLOVE BIOGEL PI INDICATOR 7.5 (GLOVE)
GLOVE BIOGEL PI INDICATOR 8.5 (GLOVE) ×2
GLOVE SURG ORTHO 8.0 STRL STRW (GLOVE) ×6 IMPLANT
GOWN STRL REUS W/ TWL LRG LVL3 (GOWN DISPOSABLE) ×1 IMPLANT
GOWN STRL REUS W/ TWL XL LVL3 (GOWN DISPOSABLE) ×2 IMPLANT
GOWN STRL REUS W/TWL 2XL LVL3 (GOWN DISPOSABLE) ×3 IMPLANT
GOWN STRL REUS W/TWL LRG LVL3 (GOWN DISPOSABLE) ×2
GOWN STRL REUS W/TWL XL LVL3 (GOWN DISPOSABLE) ×4
HANDPIECE INTERPULSE COAX TIP (DISPOSABLE) ×2
HOOD PEEL AWAY FACE SHEILD DIS (HOOD) ×9 IMPLANT
KIT BASIN OR (CUSTOM PROCEDURE TRAY) ×3 IMPLANT
KIT ROOM TURNOVER OR (KITS) ×3 IMPLANT
KNEE CAPITATED TOTAL 3 ×1 IMPLANT
MANIFOLD NEPTUNE II (INSTRUMENTS) ×3 IMPLANT
NEEDLE 18GX1X1/2 (RX/OR ONLY) (NEEDLE) IMPLANT
NEEDLE 22X1 1/2 (OR ONLY) (NEEDLE) ×6 IMPLANT
NS IRRIG 1000ML POUR BTL (IV SOLUTION) ×3 IMPLANT
PACK TOTAL JOINT (CUSTOM PROCEDURE TRAY) ×3 IMPLANT
PAD ARMBOARD 7.5X6 YLW CONV (MISCELLANEOUS) ×6 IMPLANT
SET HNDPC FAN SPRY TIP SCT (DISPOSABLE) ×1 IMPLANT
STRIP CLOSURE SKIN 1/2X4 (GAUZE/BANDAGES/DRESSINGS) ×2 IMPLANT
SUCTION FRAZIER HANDLE 10FR (MISCELLANEOUS) ×2
SUCTION TUBE FRAZIER 10FR DISP (MISCELLANEOUS) ×1 IMPLANT
SUT MNCRL AB 3-0 PS2 18 (SUTURE) ×3 IMPLANT
SUT VIC AB 0 CTB1 27 (SUTURE) ×6 IMPLANT
SUT VIC AB 1 CT1 27 (SUTURE) ×4
SUT VIC AB 1 CT1 27XBRD ANBCTR (SUTURE) ×2 IMPLANT
SUT VIC AB 2-0 CT1 27 (SUTURE) ×4
SUT VIC AB 2-0 CT1 TAPERPNT 27 (SUTURE) ×2 IMPLANT
SYR 20CC LL (SYRINGE) ×6 IMPLANT
SYR TB 1ML LUER SLIP (SYRINGE) IMPLANT
TOWEL OR 17X24 6PK STRL BLUE (TOWEL DISPOSABLE) ×3 IMPLANT
TOWEL OR 17X26 10 PK STRL BLUE (TOWEL DISPOSABLE) ×3 IMPLANT
TRAY CATH 16FR W/PLASTIC CATH (SET/KITS/TRAYS/PACK) IMPLANT
WRAP KNEE MAXI GEL POST OP (GAUZE/BANDAGES/DRESSINGS) ×3 IMPLANT

## 2016-10-27 NOTE — Anesthesia Preprocedure Evaluation (Addendum)
Anesthesia Evaluation  Patient identified by MRN, date of birth, ID band Patient awake    Reviewed: Allergy & Precautions, NPO status , Patient's Chart, lab work & pertinent test results  Airway Mallampati: II  TM Distance: >3 FB Neck ROM: Full    Dental no notable dental hx.    Pulmonary Current Smoker,    Pulmonary exam normal breath sounds clear to auscultation       Cardiovascular negative cardio ROS Normal cardiovascular exam Rhythm:Regular Rate:Normal     Neuro/Psych negative neurological ROS  negative psych ROS   GI/Hepatic negative GI ROS, (+) Hepatitis -  Endo/Other  negative endocrine ROS  Renal/GU negative Renal ROS     Musculoskeletal  (+) Arthritis , Osteoarthritis,    Abdominal   Peds  Hematology negative hematology ROS (+)   Anesthesia Other Findings   Reproductive/Obstetrics                             Anesthesia Physical Anesthesia Plan  ASA: II  Anesthesia Plan: Spinal and Regional   Post-op Pain Management:    Induction: Intravenous  PONV Risk Score and Plan:   Airway Management Planned:   Additional Equipment:   Intra-op Plan:   Post-operative Plan:   Informed Consent: I have reviewed the patients History and Physical, chart, labs and discussed the procedure including the risks, benefits and alternatives for the proposed anesthesia with the patient or authorized representative who has indicated his/her understanding and acceptance.   Dental advisory given  Plan Discussed with: CRNA  Anesthesia Plan Comments:         Anesthesia Quick Evaluation

## 2016-10-27 NOTE — Anesthesia Postprocedure Evaluation (Signed)
Anesthesia Post Note  Patient: Franklin AlexanderWilliam A Pittman  Procedure(s) Performed: Procedure(s) (LRB): LEFT TOTAL KNEE ARTHROPLASTY (Left)     Patient location during evaluation: PACU Anesthesia Type: Regional and Spinal Level of consciousness: oriented and awake and alert Pain management: pain level controlled Vital Signs Assessment: post-procedure vital signs reviewed and stable Respiratory status: spontaneous breathing, respiratory function stable and patient connected to nasal cannula oxygen Cardiovascular status: blood pressure returned to baseline and stable Postop Assessment: no headache and no backache Anesthetic complications: no    Last Vitals:  Vitals:   10/27/16 1345 10/27/16 1355  BP:  120/73  Pulse: (!) 44 (!) 43  Resp: 14 16  Temp:      Last Pain:  Vitals:   10/27/16 1330  PainSc: 0-No pain                 Kanai Hilger P Patsey Pitstick

## 2016-10-27 NOTE — Progress Notes (Signed)
Orthopedic Tech Progress Note Patient Details:  Franklin Pittman 08/22/1955 [id number]  CPM Left Knee CPM Left Knee: On Left Knee Flexion (Degrees): 90 Left Knee Extension (Degrees): 0 Additional Comments: Foot roll   Franklin Pittman 10/27/2016, 1:40 PM

## 2016-10-27 NOTE — Anesthesia Procedure Notes (Addendum)
Spinal  Patient location during procedure: OR Start time: 10/27/2016 10:00 AM End time: 10/27/2016 10:10 AM Staffing Anesthesiologist: Karna ChristmasELLENDER, RYAN P Performed: anesthesiologist  Preanesthetic Checklist Completed: patient identified, surgical consent, pre-op evaluation, timeout performed, IV checked, risks and benefits discussed and monitors and equipment checked Spinal Block Patient position: sitting Prep: DuraPrep Patient monitoring: cardiac monitor, continuous pulse ox and blood pressure Approach: midline Location: L4-5 Injection technique: single-shot Needle Needle type: Pencan  Needle gauge: 24 G Needle length: 9 cm Assessment Sensory level: T10 Additional Notes Functioning IV was confirmed and monitors were applied. Sterile prep and drape, including hand hygiene and sterile gloves were used. The patient was positioned and the spine was prepped. The skin was anesthetized with lidocaine.  Free flow of clear CSF was obtained prior to injecting local anesthetic into the CSF.  The spinal needle aspirated freely following injection.  The needle was carefully withdrawn.  The patient tolerated the procedure well.

## 2016-10-27 NOTE — Evaluation (Signed)
Physical Therapy Evaluation Patient Details Name: Franklin Pittman MRN: [id number] DOB: Nov 20, 1955 Today's Date: 10/27/2016   History of Present Illness  Pt is a 61 y/o male s/p elective L TKA, PMH includes depression and hepatitis.   Clinical Impression  Pt is s/p surgery above with deficits below. PTA, pt was using cane for short distance ambulation. Distance limited by pain in L knee at baseline. Upon eval, pt limited by post op pain and weakness, as well as, decreased balance. Pt required min guard for mobility this session. Pt reports wife will be able to assist as needed upon d/c and has all DME at home. Follow up PT per MD arrangements. Will continue to follow acutely to maximize functional mobility independence.     Follow Up Recommendations DC plan and follow up therapy as arranged by surgeon;Supervision/Assistance - 24 hour    Equipment Recommendations  None recommended by PT    Recommendations for Other Services       Precautions / Restrictions Precautions Precautions: Knee Precaution Booklet Issued: Yes (comment) Precaution Comments: Reviewed supine ther ex with pt.  Restrictions Weight Bearing Restrictions: Yes LLE Weight Bearing: Weight bearing as tolerated      Mobility  Bed Mobility Overal bed mobility: Needs Assistance Bed Mobility: Sit to Supine       Sit to supine: Supervision   General bed mobility comments: supervision for safety   Transfers Overall transfer level: Needs assistance Equipment used: Rolling walker (2 wheeled) Transfers: Sit to/from Stand Sit to Stand: Min guard         General transfer comment: Min guard for safety. Verbal cues for safe hand placement.   Ambulation/Gait Ambulation/Gait assistance: Min guard Ambulation Distance (Feet): 50 Feet Assistive device: Rolling walker (2 wheeled) Gait Pattern/deviations: Step-to pattern;Step-through pattern;Decreased stride length;Antalgic Gait velocity: Decreased Gait velocity  interpretation: Below normal speed for age/gender General Gait Details: Slow, antalgic gait secondary to post op pain and weakness. Verbal cues for upright posture and LE sequencing with RW.   Stairs            Wheelchair Mobility    Modified Rankin (Stroke Patients Only)       Balance Overall balance assessment: Needs assistance Sitting-balance support: No upper extremity supported;Feet unsupported Sitting balance-Leahy Scale: Good     Standing balance support: Bilateral upper extremity supported;During functional activity Standing balance-Leahy Scale: Poor Standing balance comment: Reliant on RW for stability                              Pertinent Vitals/Pain Pain Assessment: 0-10 Pain Score: 4  Pain Location: R knee Pain Descriptors / Indicators: Aching;Operative site guarding;Sore Pain Intervention(s): Limited activity within patient's tolerance;Monitored during session;Repositioned    Home Living Family/patient expects to be discharged to:: Private residence Living Arrangements: Spouse/significant other Available Help at Discharge: Family;Available 24 hours/day Type of Home: House Home Access: Stairs to enter Entrance Stairs-Rails: Right;Left;Can reach both Entrance Stairs-Number of Steps: 4 Home Layout: One level Home Equipment: Walker - 2 wheels;Cane - single point;Bedside commode      Prior Function Level of Independence: Independent with assistive device(s)         Comments: Occasional use of cane      Hand Dominance   Dominant Hand: Right    Extremity/Trunk Assessment   Upper Extremity Assessment Upper Extremity Assessment: Defer to OT evaluation    Lower Extremity Assessment Lower Extremity Assessment: LLE deficits/detail LLE Deficits /  Details: Sensory in tact. Able to perform exercise below. Deficits consistent with post op pain and weakness.     Cervical / Trunk Assessment Cervical / Trunk Assessment: Normal   Communication   Communication: No difficulties  Cognition Arousal/Alertness: Awake/alert Behavior During Therapy: WFL for tasks assessed/performed Overall Cognitive Status: Within Functional Limits for tasks assessed                                        General Comments      Exercises Total Joint Exercises Ankle Circles/Pumps: AROM;Both;10 reps Quad Sets: AROM;Left;10 reps Short Arc Quad: AROM;Left;10 reps Heel Slides: AROM;Left;10 reps Hip ABduction/ADduction: AROM;Left;10 reps   Assessment/Plan    PT Assessment Patient needs continued PT services  PT Problem List Decreased strength;Decreased range of motion;Decreased activity tolerance;Decreased balance;Decreased mobility;Decreased knowledge of use of DME;Decreased knowledge of precautions;Pain       PT Treatment Interventions DME instruction;Gait training;Stair training;Functional mobility training;Therapeutic activities;Therapeutic exercise;Balance training;Neuromuscular re-education;Patient/family education    PT Goals (Current goals can be found in the Care Plan section)  Acute Rehab PT Goals Patient Stated Goal: to get back to playing basketball with my daughter PT Goal Formulation: With patient Time For Goal Achievement: 11/03/16 Potential to Achieve Goals: Good    Frequency 7X/week   Barriers to discharge        Co-evaluation               AM-PAC PT "6 Clicks" Daily Activity  Outcome Measure Difficulty turning over in bed (including adjusting bedclothes, sheets and blankets)?: A Little Difficulty moving from lying on back to sitting on the side of the bed? : A Little Difficulty sitting down on and standing up from a chair with arms (e.g., wheelchair, bedside commode, etc,.)?: Total Help needed moving to and from a bed to chair (including a wheelchair)?: A Little Help needed walking in hospital room?: A Little Help needed climbing 3-5 steps with a railing? : A Little 6 Click Score:  16    End of Session Equipment Utilized During Treatment: Gait belt Activity Tolerance: Patient tolerated treatment well Patient left: in chair;with call bell/phone within reach Nurse Communication: Mobility status PT Visit Diagnosis: Other abnormalities of gait and mobility (R26.89);Pain Pain - Right/Left: Left Pain - part of body: Knee    Time: [id number] PT Time Calculation (min) (ACUTE ONLY): 27 min   Charges:   PT Evaluation $PT Eval Low Complexity: 1 Low PT Treatments $Gait Training: 8-22 mins   PT G Codes:   PT G-Codes **NOT FOR INPATIENT CLASS** Functional Assessment Tool Used: AM-PAC 6 Clicks Basic Mobility Functional Limitation: Mobility: Walking and moving around Mobility: Walking and Moving Around Current Status ([id number](G8978): At least 40 percent but less than 60 percent impaired, limited or restricted Mobility: Walking and Moving Around Goal Status [phone](G8979): At least 1 percent but less than 20 percent impaired, limited or restricted    Gladys DammeBrittany June Vacha, PT, DPT  Acute Rehabilitation Services  Pager: [phone]   Lehman PromBrittany S Josip Merolla 10/27/2016, 6:45 PM

## 2016-10-27 NOTE — Anesthesia Procedure Notes (Signed)
Anesthesia Regional Block: Adductor canal block   Pre-Anesthetic Checklist: ,, timeout performed, Correct Patient, Correct Site, Correct Laterality, Correct Procedure,, site marked, risks and benefits discussed, Surgical consent,  Pre-op evaluation,  At surgeon's request and post-op pain management  Laterality: Left  Prep: chloraprep       Needles:  Injection technique: Single-shot  Needle Type: Echogenic Stimulator Needle     Needle Length: 9cm  Needle Gauge: 21     Additional Needles:   Procedures: ultrasound guided,,,,,,,,  Narrative:  Start time: 10/27/2016 8:50 AM End time: 10/27/2016 9:00 AM Injection made incrementally with aspirations every 5 mL.  Performed by: Personally  Anesthesiologist: Karna ChristmasELLENDER, RYAN P  Additional Notes: Functioning IV was confirmed and monitors were applied.  A 90mm 21ga Arrow echogenic stimulator needle was used. Sterile prep,hand hygiene and sterile gloves were used.  Negative aspiration and negative test dose prior to incremental administration of local anesthetic. The patient tolerated the procedure well.

## 2016-10-27 NOTE — Transfer of Care (Signed)
Immediate Anesthesia Transfer of Care Note  Patient: Franklin Pittman  Procedure(s) Performed: Procedure(s): LEFT TOTAL KNEE ARTHROPLASTY (Left)  Patient Location: PACU  Anesthesia Type:MAC and Spinal  Level of Consciousness: awake, alert , oriented and patient cooperative  Airway & Oxygen Therapy: Patient Spontanous Breathing and Patient connected to nasal cannula oxygen  Post-op Assessment: Report given to RN and Post -op Vital signs reviewed and stable  Post vital signs: Reviewed and stable  Last Vitals:  Vitals:   10/27/16 0910 10/27/16 1211  BP: (!) 146/101 119/80  Pulse: (!) 52 (!) 57  Resp: 19 18  Temp:      Last Pain:  Vitals:   10/27/16 0905  PainSc: 0-No pain      Patients Stated Pain Goal: 5 (03/55/97 4163)  Complications: No apparent anesthesia complications

## 2016-10-28 ENCOUNTER — Encounter (HOSPITAL_COMMUNITY): Payer: Self-pay | Admitting: General Practice

## 2016-10-28 DIAGNOSIS — M1712 Unilateral primary osteoarthritis, left knee: Secondary | ICD-10-CM | POA: Diagnosis not present

## 2016-10-28 DIAGNOSIS — F17210 Nicotine dependence, cigarettes, uncomplicated: Secondary | ICD-10-CM | POA: Diagnosis not present

## 2016-10-28 DIAGNOSIS — Z96652 Presence of left artificial knee joint: Secondary | ICD-10-CM | POA: Diagnosis not present

## 2016-10-28 LAB — CBC
HEMATOCRIT: 38.4 % — AB (ref 39.0–52.0)
Hemoglobin: 12.6 g/dL — ABNORMAL LOW (ref 13.0–17.0)
MCH: 29.5 pg (ref 26.0–34.0)
MCHC: 32.8 g/dL (ref 30.0–36.0)
MCV: 89.9 fL (ref 78.0–100.0)
PLATELETS: 248 10*3/uL (ref 150–400)
RBC: 4.27 MIL/uL (ref 4.22–5.81)
RDW: 13.7 % (ref 11.5–15.5)
WBC: 12.3 10*3/uL — ABNORMAL HIGH (ref 4.0–10.5)

## 2016-10-28 LAB — BASIC METABOLIC PANEL
ANION GAP: 11 (ref 5–15)
BUN: 17 mg/dL (ref 6–20)
CALCIUM: 8.3 mg/dL — AB (ref 8.9–10.3)
CO2: 22 mmol/L (ref 22–32)
CREATININE: 1.18 mg/dL (ref 0.61–1.24)
Chloride: 101 mmol/L (ref 101–111)
GFR calc Af Amer: >60 mL/min (ref >60)
GLUCOSE: 107 mg/dL — AB (ref 65–99)
Potassium: 3.7 mmol/L (ref 3.5–5.1)
Sodium: 134 mmol/L — ABNORMAL LOW (ref 135–145)

## 2016-10-28 MED ORDER — ASPIRIN 325 MG PO TBEC: 325.0000 mg | DELAYED_RELEASE_TABLET | Freq: Two times a day (BID) | ORAL | 0 refills | Status: DC

## 2016-10-28 MED ORDER — OXYCODONE HCL 10 MG PO TABS: 10.0000 mg | ORAL_TABLET | ORAL | 0 refills | Status: DC | PRN

## 2016-10-28 MED ORDER — METHOCARBAMOL 500 MG PO TABS: 500.0000 mg | ORAL_TABLET | Freq: Four times a day (QID) | ORAL | 0 refills | Status: DC | PRN

## 2016-10-28 NOTE — Progress Notes (Signed)
Patient ready for discharge from unit to home. Family in to accompany. All discharge instructions and rx reviewed with pt. No distress noted. All personal belongings with pt

## 2016-10-28 NOTE — Evaluation (Signed)
Occupational Therapy Evaluation Patient Details Name: Franklin Pittman MRN: [id number] DOB: 1955-10-25 Today's Date: 10/28/2016    History of Present Illness Pt is a 61 y/o male s/p elective L TKA, PMH includes depression and hepatitis.    Clinical Impression   This  61 y/o M presents with the above. At baseline Pt was mod independent with ADLs and functional mobility, using a SPC occasionally. Pt currently requires MinGuard assist for functional mobility with RW and for UB/LB ADLs. Pt required max verbal safety cues during session for functional mobility and use of RW during ADL completion, demonstrating good carryover after education provided. Will continue to follow while in acute setting to emphasize safety during ADLs and functional mobility and to maximize overall safety and independence with ADLs and functional mobility prior to discharge home.     Follow Up Recommendations  DC plan and follow up therapy as arranged by surgeon;Supervision/Assistance - 24 hour    Equipment Recommendations  None recommended by OT           Precautions / Restrictions Precautions Precautions: Knee Precaution Booklet Issued: Yes (comment) Precaution Comments: reviewed knee precautions with Pt and spouse  Restrictions Weight Bearing Restrictions: Yes LLE Weight Bearing: Weight bearing as tolerated      Mobility Bed Mobility Overal bed mobility: Modified Independent             General bed mobility comments:   Transfers Overall transfer level: Needs assistance Equipment used: Rolling walker (2 wheeled) Transfers: Sit to/from Stand Sit to Stand: Min guard         General transfer comment: Pt able to sit to stand from EOB using BUE to elevate pelvis from bed safely and without cues.     Balance Overall balance assessment: Needs assistance Sitting-balance support: No upper extremity supported;Feet supported Sitting balance-Leahy Scale: Good     Standing balance support: No upper  extremity supported;During functional activity Standing balance-Leahy Scale: Fair Standing balance comment: Reliant on RW for stability                            ADL either performed or assessed with clinical judgement   ADL Overall ADL's : Needs assistance/impaired Eating/Feeding: Independent;Sitting   Grooming: Wash/dry hands;Min guard;Standing   Upper Body Bathing: Min guard;Sitting   Lower Body Bathing: Min guard;Sit to/from stand   Upper Body Dressing : Min guard;Sitting   Lower Body Dressing: Min guard;Sit to/from stand   Toilet Transfer: Min guard;Ambulation;Regular Social worker and Hygiene: Min guard;Sit to/from stand   Tub/ Shower Transfer: Min guard;Ambulation;Rolling walker Tub/Shower Transfer Details (indicate cue type and reason): simulated in room; minguard for safety with Pt demonstrating good carryover of technique/sequence  Functional mobility during ADLs: Min guard;Rolling walker General ADL Comments: Upon entering room Pt proceeded to rise from EOB and walk towards bathroom without RW; Pt required verbal cues and education on safety during functional mobility including safe use of RW when navigating pathways in bedroom                          Pertinent Vitals/Pain Pain Assessment: No/denies pain Faces Pain Scale: Hurts a little bit Pain Location: R knee Pain Descriptors / Indicators: Aching;Operative site guarding;Sore Pain Intervention(s): Limited activity within patient's tolerance;Monitored during session;Ice applied;Repositioned     Hand Dominance Right   Extremity/Trunk Assessment Upper Extremity Assessment Upper Extremity Assessment: Overall WFL for tasks  assessed   Lower Extremity Assessment Lower Extremity Assessment: Defer to PT evaluation   Cervical / Trunk Assessment Cervical / Trunk Assessment: Normal   Communication Communication Communication: No difficulties   Cognition  Arousal/Alertness: Awake/alert Behavior During Therapy: WFL for tasks assessed/performed;Impulsive Overall Cognitive Status: Within Functional Limits for tasks assessed                                 General Comments: fast moving; decreased safety awareness requiring verbal cues - good carry over after education and safety cues provided     None.               Home Living Family/patient expects to be discharged to:: Private residence Living Arrangements: Spouse/significant other Available Help at Discharge: Family;Available 24 hours/day Type of Home: House Home Access: Stairs to enter Entergy CorporationEntrance Stairs-Number of Steps: 4 Entrance Stairs-Rails: Right;Left;Can reach both Home Layout: One level     Bathroom Shower/Tub: Producer, television/film/videoWalk-in shower   Bathroom Toilet: Standard     Home Equipment: Environmental consultantWalker - 2 wheels;Cane - single point;Bedside commode;Shower seat - built in          Prior Functioning/Environment Level of Independence: Independent with assistive device(s)        Comments: Occasional use of cane         OT Problem List: Decreased strength;Decreased knowledge of use of DME or AE;Decreased safety awareness;Decreased knowledge of precautions;Impaired balance (sitting and/or standing)      OT Treatment/Interventions: Self-care/ADL training;DME and/or AE instruction;Therapeutic activities;Balance training;Therapeutic exercise;Patient/family education    OT Goals(Current goals can be found in the care plan section) Acute Rehab OT Goals Patient Stated Goal: to get back to playing basketball with my daughter OT Goal Formulation: With patient Time For Goal Achievement: 11/04/16 Potential to Achieve Goals: Good  OT Frequency: Min 2X/week                             AM-PAC PT "6 Clicks" Daily Activity     Outcome Measure Help from another person eating meals?: None Help from another person taking care of personal grooming?: A Little Help from  another person toileting, which includes using toliet, bedpan, or urinal?: A Little Help from another person bathing (including washing, rinsing, drying)?: A Little Help from another person to put on and taking off regular upper body clothing?: None Help from another person to put on and taking off regular lower body clothing?: A Little 6 Click Score: 20   End of Session Equipment Utilized During Treatment: Rolling walker CPM Left Knee CPM Left Knee: On Left Knee Flexion (Degrees): 90 Left Knee Extension (Degrees): 0 Nurse Communication: Mobility status  Activity Tolerance: Patient tolerated treatment well Patient left: in chair;with call bell/phone within reach  OT Visit Diagnosis: Muscle weakness (generalized) (M62.81)                Time: [id number] OT Time Calculation (min): 23 min Charges:  OT General Charges $OT Visit: 1 Procedure OT Evaluation $OT Eval Low Complexity: 1 Procedure G-Codes: OT G-codes **NOT FOR INPATIENT CLASS** Functional Assessment Tool Used: AM-PAC 6 Clicks Daily Activity;Clinical judgement Functional Limitation: Self care Self Care Current Status (A2130(G8987): At least 1 percent but less than 20 percent impaired, limited or restricted Self Care Goal Status (Q6578(G8988): 0 percent impaired, limited or restricted   Marcy SirenBreanna Amoreena Neubert, OT Pager [phone] 10/28/2016   Kaleen OdeaBreanna L  Orvan Falconer 10/28/2016, 9:49 AM

## 2016-10-28 NOTE — Progress Notes (Signed)
Cryotherapy ice packs home with patient

## 2016-10-28 NOTE — Care Management Note (Signed)
Case Management Note  Patient Details  Name: Nena AlexanderWilliam A Symanski MRN: [id number] Date of Birth: 05/14/1955  Subjective/Objective: 61 yr old gentleman s/p left total knee arthroplasty.                  Action/Plan: Case manager spoke with patient concerning discharge plan. He was preoperatively setup with [facility], no changes, has all necessary DME. Patient will have family support at discharge.   Expected Discharge Date:  10/28/16               Expected Discharge Plan:  Home w Home Health Services  In-House Referral:  NA  Discharge planning Services  CM Consult  Post Acute Care Choice:  Durable Medical Equipment, Home Health Choice offered to:  Patient  DME Arranged:  CPM, Walker rolling DME Agency:  [employer]  HH Arranged:  PT HH [facility]  [facility]  Status of Service:  Completed, signed off  If discussed at [employer] Meetings, dates discussed:    Additional Comments:  Durenda GuthrieBrady, Sherwood Castilla Naomi, RN 10/28/2016, 12:07 PM

## 2016-10-28 NOTE — Op Note (Addendum)
TOTAL KNEE REPLACEMENT OPERATIVE NOTE:  10/27/2016  11:51 AM  PATIENT:  Franklin Pittman  61 y.o. male  PRE-OPERATIVE DIAGNOSIS:  primary osteoarthritis left knee  POST-OPERATIVE DIAGNOSIS:  primary osteoarthritis left knee  PROCEDURE:  Procedure(s): LEFT TOTAL KNEE ARTHROPLASTY  SURGEON:  Surgeon(s): Dannielle Huh, MD   ASSISTANT: Laurier Nancy, PA-C  ANESTHESIA:   spinal  DRAINS: Hemovac  SPECIMEN: None  COUNTS:  Correct  TOURNIQUET:   Total Tourniquet Time Documented: Thigh (Left) - 54 minutes Total: Thigh (Left) - 54 minutes   DICTATION:  Indication for procedure:    The patient is a 61 y.o. male who has failed conservative treatment for primary osteoarthritis left knee.  Informed consent was obtained prior to anesthesia. The risks versus benefits of the operation were explain and in a way the patient can, and did, understand.   On the implant demand matching protocol, this patient scored 10.  Therefore, this patient was not receive a polyethylene insert with vitamin E which is a high demand implant.  Description of procedure:     The patient was taken to the operating room and placed under anesthesia.  The patient was positioned in the usual fashion taking care that all body parts were adequately padded and/or protected.  I foley catheter was not placed.  A tourniquet was applied and the leg prepped and draped in the usual sterile fashion.  The extremity was exsanguinated with the esmarch and tourniquet inflated to 350 mmHg.  Pre-operative range of motion was normal.  The knee was in 5 degree of mild varus.  A midline incision approximately 6-7 inches long was made with a #10 blade.  A new blade was used to make a parapatellar arthrotomy going 2-3 cm into the quadriceps tendon, over the patella, and alongside the medial aspect of the patellar tendon.  A synovectomy was then performed with the #10 blade and forceps. I then elevated the deep MCL off the medial tibial  metaphysis subperiosteally around to the semimembranosus attachment.    I everted the patella and used calipers to measure patellar thickness.  I used the reamer to ream down to appropriate thickness to recreate the native thickness.  I then removed excess bone with the rongeur and sagittal saw.  I used the appropriately sized template and drilled the three lug holes.  I then put the trial in place and measured the thickness with the calipers to ensure recreation of the native thickness.  The trial was then removed and the patella subluxed and the knee brought into flexion.  A homan retractor was place to retract and protect the patella and lateral structures.  A Z-retractor was place medially to protect the medial structures.  The extra-medullary alignment system was used to make cut the tibial articular surface perpendicular to the anamotic axis of the tibia and in 3 degrees of posterior slope.  The cut surface and alignment jig was removed.  I then used the intramedullary alignment guide to make a 6 valgus cut on the distal femur.  I then marked out the epicondylar axis on the distal femur.  The posterior condylar axis measured 3 degrees.  I then used the anterior referencing sizer and measured the femur to be a size 10.  The 4-In-1 cutting block was screwed into place in external rotation matching the posterior condylar angle, making our cuts perpendicular to the epicondylar axis.  Anterior, posterior and chamfer cuts were made with the sagittal saw.  The cutting block and cut pieces  were removed.  A lamina spreader was placed in 90 degrees of flexion.  The ACL, PCL, menisci, and posterior condylar osteophytes were removed.  A 16 mm spacer blocked was found to offer good flexion and extension gap balance after mild in degree releasing.   The scoop retractor was then placed and the femoral finishing block was pinned in place.  The small sagittal saw was used as well as the lug drill to finish the femur.   The block and cut surfaces were removed and the medullary canal hole filled with autograft bone from the cut pieces.  The tibia was delivered forward in deep flexion and external rotation.  A size F tray was selected and pinned into place centered on the medial 1/3 of the tibial tubercle.  The reamer and keel was used to prepare the tibia through the tray.    I then trialed with the size 10 femur, size F tibia, a 16 mm insert and the 35 patella.  I had excellent flexion/extension gap balance, excellent patella tracking.  Flexion was full and beyond 120 degrees; extension was zero.  These components were chosen and the staff opened them to me on the back table while the knee was lavaged copiously and the cement mixed.  The soft tissue was infiltrated with 60cc of exparel 1.3% through a 21 gauge needle.  I cemented in the components and removed all excess cement.  The polyethylene tibial component was snapped into place and the knee placed in extension while cement was hardening.  The capsule was infilltrated with 30cc of .25% Marcaine with epinephrine.  A hemovac was place in the joint exiting superolaterally.  A pain pump was place superomedially superficial to the arthrotomy.  Once the cement was hard, the tourniquet was let down.  Hemostasis was obtained.  The arthrotomy was closed with figure-8 #1 vicryl sutures.  The deep soft tissues were closed with #0 vicryls and the subcuticular layer closed with a running #2-0 vicryl.  The skin was reapproximated and closed with skin staples.  The wound was dressed with xeroform, 4 x4's, 2 ABD sponges, a single layer of webril and a TED stocking.   The patient was then awakened, extubated, and taken to the recovery room in stable condition.  BLOOD LOSS:  300cc DRAINS: 1 hemovac, 1 pain catheter COMPLICATIONS:  None.  PLAN OF CARE: Admit to inpatient   PATIENT DISPOSITION:  PACU - hemodynamically stable.   Delay start of Pharmacological VTE agent (>24hrs)  due to surgical blood loss or risk of bleeding:  not applicable  Please fax a copy of this op note to my office at [phone] (please only include page 1 and 2 of the Case Information op note)

## 2016-10-28 NOTE — Progress Notes (Signed)
SPORTS MEDICINE AND JOINT REPLACEMENT  Franklin SpurlingStephen Lucey, MD    [name], PA-C [address], [place], [place]  [id number]                             [phone]   PROGRESS NOTE  Subjective:  negative for Chest Pain  negative for Shortness of Breath  negative for Nausea/Vomiting   negative for Calf Pain  negative for Bowel Movement   Tolerating Diet: yes         Patient reports pain as 4 on 0-10 scale.    Objective: Vital signs in last 24 hours:   Patient Vitals for the past 24 hrs:  BP Temp Temp src Pulse Resp SpO2  10/28/16 0630 (!) 153/82 98.1 F (36.7 C) Oral 65 19 100 %  10/27/16 2357 (!) 145/89 98.2 F (36.8 C) Oral 71 20 98 %  10/27/16 2152 (!) 155/100 98.7 F (37.1 C) Oral 80 20 100 %  10/27/16 1500 (!) 148/86 (!) 97.3 F (36.3 C) Oral (!) 51 - 100 %  10/27/16 1445 (!) 138/93 - - (!) 52 20 100 %  10/27/16 1430 - 98.1 F (36.7 C) - (!) 49 (!) 21 100 %  10/27/16 1426 136/87 - - (!) 50 19 100 %  10/27/16 1415 (!) 132/98 - - (!) 46 13 100 %  10/27/16 1400 - - - (!) 49 13 100 %  10/27/16 1355 120/73 - - (!) 43 16 100 %  10/27/16 1345 - - - (!) 44 14 100 %  10/27/16 1340 115/73 - - (!) 47 19 99 %  10/27/16 1330 - 98.1 F (36.7 C) - (!) 49 15 100 %  10/27/16 1326 (!) 143/85 - - (!) 47 16 100 %  10/27/16 1315 - - - (!) 43 15 100 %  10/27/16 1310 (!) 142/93 - - (!) 48 18 100 %  10/27/16 1300 - - - (!) 45 16 100 %  10/27/16 1255 (!) 139/92 - - (!) 46 15 100 %  10/27/16 1245 - - - (!) 55 17 100 %  10/27/16 1240 129/90 - - (!) 49 18 100 %  10/27/16 1230 - - - (!) 51 10 100 %  10/27/16 1225 114/80 - - 61 (!) 23 99 %  10/27/16 1215 - - - (!) 56 15 99 %  10/27/16 1211 119/80 (!) 97 F (36.1 C) - (!) 57 18 97 %  10/27/16 1210 119/80 - - - - -  10/27/16 0910 (!) 146/101 - - (!) 52 19 100 %  10/27/16 0905 - - - 62 (!) 21 100 %  10/27/16 0845 (!) 146/98 - - (!) 52 (!) 21 100 %  10/27/16 0840 (!) 146/98 - - - - -  10/27/16 0803 - 98.1 F (36.7 C) - - - -   10/27/16 0756 (!) 157/102 - - (!) 53 20 100 %    @flow {1959:LAST@   Intake/Output from previous day:   08/06 0701 - 08/07 0700 In: 500 [I.V.:500] Out: 300 [Urine:250]   Intake/Output this shift:   No intake/output data recorded.   Intake/Output      08/06 0701 - 08/07 0700 08/07 0701 - 08/08 0700   I.V. 500    Total Intake 500     Urine 250    Blood 50    Total Output 300     Net +200  Urine Occurrence 1 x       LABORATORY DATA: No results for input(s): WBC, HGB, HCT, PLT in the last 168 hours. No results for input(s): NA, K, CL, CO2, BUN, CREATININE, GLUCOSE, CALCIUM in the last 168 hours. Lab Results  Component Value Date   INR 1.0 02/02/2007   INR 1.6 (H) 01/29/2007    Examination:  General appearance: alert, cooperative and no distress Extremities: extremities normal, atraumatic, no cyanosis or edema  Wound Exam: clean, dry, intact   Drainage:  None: wound tissue dry  Motor Exam: Quadriceps and Hamstrings Intact  Sensory Exam: Superficial Peroneal, Deep Peroneal and Tibial normal   Assessment:    1 Day Post-Op  Procedure(s) (LRB): LEFT TOTAL KNEE ARTHROPLASTY (Left)  ADDITIONAL DIAGNOSIS:  Active Problems:   S/P total knee replacement     Plan: Physical Therapy as ordered Weight Bearing as Tolerated (WBAT)  DVT Prophylaxis:  Aspirin  DISCHARGE PLAN: Home  DISCHARGE NEEDS: HHPT   Patient doing great and is ready for D/C         Guy Sandifer 10/28/2016, 7:07 AM

## 2016-10-28 NOTE — Progress Notes (Signed)
Physical Therapy Treatment Patient Details Name: Franklin Pittman MRN: [id number] DOB: 1955/06/23 Today's Date: 10/28/2016    History of Present Illness Pt is a 61 y/o male s/p elective L TKA, PMH includes depression and hepatitis.     PT Comments    Pt performed well during tx and has completed all goals this am. Pt able to significantly increase gait distance with min cueing to "stand up tall" for better posture, and to increase step length on RLE to increase weight shift on LLE for better gait sequencing. Pt responds well to cueing and eager to leave this am. Pt performing all HEP exercises well with min cueing for technique. Plan of care remains appropriate. Focus for next tx to increase static standing exercises to help increase BLE strength.   Follow Up Recommendations  DC plan and follow up therapy as arranged by surgeon;Supervision/Assistance - 24 hour     Equipment Recommendations  None recommended by PT    Recommendations for Other Services       Precautions / Restrictions Precautions Precautions: Knee Precaution Booklet Issued: Yes (comment) Precaution Comments: reviewed knee precautions with Pt and spouse  Restrictions Weight Bearing Restrictions: Yes LLE Weight Bearing: Weight bearing as tolerated    Mobility  Bed Mobility Overal bed mobility: Modified Independent             General bed mobility comments: Pt in chair upon arrival.   Transfers Overall transfer level: Needs assistance Equipment used: Rolling walker (2 wheeled) Transfers: Sit to/from Stand Sit to Stand: Min guard         General transfer comment: Pt able to sit to stand from EOB using BUE to elevate pelvis from bed safely and without cues.   Ambulation/Gait Ambulation/Gait assistance: Supervision Ambulation Distance (Feet): 550 Feet Assistive device: Rolling walker (2 wheeled) Gait Pattern/deviations: Step-to pattern;Decreased stance time - left;Decreased weight shift to left;Trunk  flexed;Decreased step length - right Gait velocity: Decreased   General Gait Details: pt required cueing to decrease trunk flexion and to increase step length with R foot to assist in increasing stance stime on L foot. Pt responds well to cueing.    Stairs Stairs: Yes  Supervision  Stair Management: Forwards;One rail Right Number of Stairs: 10 General stair comments: Pt able to perform stair training with min cueing to slow down. Pt able to recall "up with the good, down with bad".    Wheelchair Mobility    Modified Rankin (Stroke Patients Only)       Balance Overall balance assessment: Needs assistance Sitting-balance support: No upper extremity supported;Feet supported Sitting balance-Leahy Scale: Good     Standing balance support: No upper extremity supported;During functional activity Standing balance-Leahy Scale: Fair Standing balance comment: Reliant on RW for stability                             Cognition Arousal/Alertness: Awake/alert Behavior During Therapy: WFL for tasks assessed/performed;Impulsive Overall Cognitive Status: Within Functional Limits for tasks assessed                                 General Comments: fast moving; decreased safety awareness requiring verbal cues - good carry over after education and safety cues provided       Exercises Total Joint Exercises Hip ABduction/ADduction: AROM;Both;10 reps;Standing Long Arc Quad: AROM;Left;10 reps;Seated Goniometric ROM: Grossly 0-95 deg.    General  Comments General comments (skin integrity, edema, etc.): None.      Pertinent Vitals/Pain Pain Assessment: No/denies pain Faces Pain Scale: Hurts a little bit Pain Location: R knee Pain Descriptors / Indicators: Aching;Operative site guarding;Sore Pain Intervention(s): Limited activity within patient's tolerance;Monitored during session;Ice applied;Repositioned    Home Living Family/patient expects to be discharged to::  Private residence Living Arrangements: Spouse/significant other;Children Available Help at Discharge: Family;Available 24 hours/day Type of Home: House Home Access: Stairs to enter Entrance Stairs-Rails: Right;Left;Can reach both Home Layout: One level Home Equipment: Environmental consultantWalker - 2 wheels;Cane - single point;Bedside commode;Shower seat - built in      Prior Function Level of Independence: Independent with assistive device(s)      Comments: Occasional use of cane    PT Goals (current goals can now be found in the care plan section) Acute Rehab PT Goals Patient Stated Goal: to get back to playing basketball with my daughter PT Goal Formulation: With patient Potential to Achieve Goals: Good Progress towards PT goals: Progressing toward goals    Frequency    7X/week      PT Plan      Co-evaluation              AM-PAC PT "6 Clicks" Daily Activity  Outcome Measure  Difficulty turning over in bed (including adjusting bedclothes, sheets and blankets)?: A Little Difficulty moving from lying on back to sitting on the side of the bed? : A Little Difficulty sitting down on and standing up from a chair with arms (e.g., wheelchair, bedside commode, etc,.)?: Total Help needed moving to and from a bed to chair (including a wheelchair)?: A Little Help needed walking in hospital room?: A Little Help needed climbing 3-5 steps with a railing? : A Little 6 Click Score: 16    End of Session Equipment Utilized During Treatment: Gait belt Activity Tolerance: Patient tolerated treatment well Patient left: in chair;with call bell/phone within reach Nurse Communication: Mobility status PT Visit Diagnosis: Other abnormalities of gait and mobility (R26.89);Pain Pain - Right/Left: Left Pain - part of body: Knee     Time: [id number] PT Time Calculation (min) (ACUTE ONLY): 27 min  Charges:  $Gait Training: 8-22 mins $Therapeutic Exercise: 8-22 mins                    G CodesFerman Hamming:        Jarrius [place], [place] [phone]   Ferman HammingSean Divine Hansley 10/28/2016, 1:01 PM

## 2016-10-28 NOTE — Discharge Summary (Signed)
SPORTS MEDICINE & JOINT REPLACEMENT   Georgena Spurling, MD   Laurier Nancy, PA-C [address] [place], [place], [place]  [address]                             [phone]  PATIENT ID: Franklin Pittman        MRN:  [id number]          DOB/AGE: 1956/01/11 / 61 y.o.    DISCHARGE SUMMARY  ADMISSION DATE:    10/27/2016 DISCHARGE DATE:   10/28/2016   ADMISSION DIAGNOSIS: primary osteoarthritis left knee    DISCHARGE DIAGNOSIS:  primary osteoarthritis left knee    ADDITIONAL DIAGNOSIS: Active Problems:   S/P total knee replacement  Past Medical History:  Diagnosis Date  . Depression    history of  . Hepatitis 2014   "c"- took pills     PROCEDURE: Procedure(s): LEFT TOTAL KNEE ARTHROPLASTY on 10/27/2016  CONSULTS:    HISTORY:  See H&P in chart  HOSPITAL COURSE:  Franklin Pittman is a 61 y.o. admitted on 10/27/2016 and found to have a diagnosis of primary osteoarthritis left knee.  After appropriate laboratory studies were obtained  they were taken to the operating room on 10/27/2016 and underwent Procedure(s): LEFT TOTAL KNEE ARTHROPLASTY.   They were given perioperative antibiotics:  Anti-infectives    Start     Dose/Rate Route Frequency Ordered Stop   10/27/16 1600  ceFAZolin (ANCEF) IVPB 1 g/50 mL premix     1 g 100 mL/hr over 30 Minutes Intravenous Every 6 hours 10/27/16 1458 10/27/16 2321   10/27/16 0714  ceFAZolin (ANCEF) IVPB 2g/100 mL premix     2 g 200 mL/hr over 30 Minutes Intravenous On call to O.R. 10/27/16 2130 10/27/16 1014    .  Patient given tranexamic acid IV or topical and exparel intra-operatively.  Tolerated the procedure well.    POD# 1: Vital signs were stable.  Patient denied Chest pain, shortness of breath, or calf pain.  Patient was started on Lovenox 30 mg subcutaneously twice daily at 8am.  Consults to PT, OT, and care management were made.  The patient was weight bearing as tolerated.  CPM was placed on the operative leg 0-90 degrees for 6-8 hours a day.  When out of the CPM, patient was placed in the foam block to achieve full extension. Incentive spirometry was taught.  Dressing was changed.       POD #2, Continued  PT for ambulation and exercise program.  IV saline locked.  O2 discontinued.    The remainder of the hospital course was dedicated to ambulation and strengthening.   The patient was discharged on 1 Day Post-Op in  Good condition.  Blood products given:none  DIAGNOSTIC STUDIES: Recent vital signs: Patient Vitals for the past 24 hrs:  BP Temp Temp src Pulse Resp SpO2  10/28/16 0630 (!) 153/82 98.1 F (36.7 C) Oral 65 19 100 %  10/27/16 2357 (!) 145/89 98.2 F (36.8 C) Oral 71 20 98 %  10/27/16 2152 (!) 155/100 98.7 F (37.1 C) Oral 80 20 100 %  10/27/16 1500 (!) 148/86 (!) 97.3 F (36.3 C) Oral (!) 51 - 100 %  10/27/16 1445 (!) 138/93 - - (!) 52 20 100 %  10/27/16 1430 - 98.1 F (36.7 C) - (!) 49 (!) 21 100 %  10/27/16 1426 136/87 - - (!) 50 19 100 %  10/27/16 1415 (!) 132/98 - - Marland Kitchen)  46 13 100 %  10/27/16 1400 - - - (!) 49 13 100 %  10/27/16 1355 120/73 - - (!) 43 16 100 %  10/27/16 1345 - - - (!) 44 14 100 %  10/27/16 1340 115/73 - - (!) 47 19 99 %  10/27/16 1330 - 98.1 F (36.7 C) - (!) 49 15 100 %  10/27/16 1326 (!) 143/85 - - (!) 47 16 100 %  10/27/16 1315 - - - (!) 43 15 100 %  10/27/16 1310 (!) 142/93 - - (!) 48 18 100 %  10/27/16 1300 - - - (!) 45 16 100 %  10/27/16 1255 (!) 139/92 - - (!) 46 15 100 %  10/27/16 1245 - - - (!) 55 17 100 %  10/27/16 1240 129/90 - - (!) 49 18 100 %  10/27/16 1230 - - - (!) 51 10 100 %  10/27/16 1225 114/80 - - 61 (!) 23 99 %  10/27/16 1215 - - - (!) 56 15 99 %  10/27/16 1211 119/80 (!) 97 F (36.1 C) - (!) 57 18 97 %  10/27/16 1210 119/80 - - - - -  10/27/16 0910 (!) 146/101 - - (!) 52 19 100 %  10/27/16 0905 - - - 62 (!) 21 100 %  10/27/16 0845 (!) 146/98 - - (!) 52 (!) 21 100 %  10/27/16 0840 (!) 146/98 - - - - -  10/27/16 0803 - 98.1 F (36.7 C) - - - -  10/27/16  0756 (!) 157/102 - - (!) 53 20 100 %       Recent laboratory studies: No results for input(s): WBC, HGB, HCT, PLT in the last 168 hours. No results for input(s): NA, K, CL, CO2, BUN, CREATININE, GLUCOSE, CALCIUM in the last 168 hours. Lab Results  Component Value Date   INR 1.0 02/02/2007   INR 1.6 (H) 01/29/2007     Recent Radiographic Studies :  No results found.  DISCHARGE INSTRUCTIONS: Discharge Instructions    CPM    Complete by:  As directed    Continuous passive motion machine (CPM):      Use the CPM from 0 to 90 for 4-6 hours per day.      You may increase by 10 per day.  You may break it up into 2 or 3 sessions per day.      Use CPM for 2 weeks or until you are told to stop.   Call MD / Call 911    Complete by:  As directed    If you experience chest pain or shortness of breath, CALL 911 and be transported to the hospital emergency room.  If you develope a fever above 101 F, pus (white drainage) or increased drainage or redness at the wound, or calf pain, call your surgeon's office.   Constipation Prevention    Complete by:  As directed    Drink plenty of fluids.  Prune juice may be helpful.  You may use a stool softener, such as Colace (over the counter) 100 mg twice a day.  Use MiraLax (over the counter) for constipation as needed.   Diet - low sodium heart healthy    Complete by:  As directed    Discharge instructions    Complete by:  As directed    INSTRUCTIONS AFTER JOINT REPLACEMENT   Remove items at home which could result in a fall. This includes throw rugs or furniture in walking pathways ICE to the affected  joint every three hours while awake for 30 minutes at a time, for at least the first 3-5 days, and then as needed for pain and swelling.  Continue to use ice for pain and swelling. You may notice swelling that will progress down to the foot and ankle.  This is normal after surgery.  Elevate your leg when you are not up walking on it.   Continue to use the  breathing machine you got in the hospital (incentive spirometer) which will help keep your temperature down.  It is common for your temperature to cycle up and down following surgery, especially at night when you are not up moving around and exerting yourself.  The breathing machine keeps your lungs expanded and your temperature down.   DIET:  As you were doing prior to hospitalization, we recommend a well-balanced diet.  DRESSING / WOUND CARE / SHOWERING  Keep the surgical dressing until follow up.  The dressing is water proof, so you can shower without any extra covering.  IF THE DRESSING FALLS OFF or the wound gets wet inside, change the dressing with sterile gauze.  Please use good hand washing techniques before changing the dressing.  Do not use any lotions or creams on the incision until instructed by your surgeon.    ACTIVITY  Increase activity slowly as tolerated, but follow the weight bearing instructions below.   No driving for 6 weeks or until further direction given by your physician.  You cannot drive while taking narcotics.  No lifting or carrying greater than 10 lbs. until further directed by your surgeon. Avoid periods of inactivity such as sitting longer than an hour when not asleep. This helps prevent blood clots.  You may return to work once you are authorized by your doctor.     WEIGHT BEARING   Weight bearing as tolerated with assist device (walker, cane, etc) as directed, use it as long as suggested by your surgeon or therapist, typically at least 4-6 weeks.   EXERCISES  Results after joint replacement surgery are often greatly improved when you follow the exercise, range of motion and muscle strengthening exercises prescribed by your doctor. Safety measures are also important to protect the joint from further injury. Any time any of these exercises cause you to have increased pain or swelling, decrease what you are doing until you are comfortable again and then slowly  increase them. If you have problems or questions, call your caregiver or physical therapist for advice.   Rehabilitation is important following a joint replacement. After just a few days of immobilization, the muscles of the leg can become weakened and shrink (atrophy).  These exercises are designed to build up the tone and strength of the thigh and leg muscles and to improve motion. Often times heat used for twenty to thirty minutes before working out will loosen up your tissues and help with improving the range of motion but do not use heat for the first two weeks following surgery (sometimes heat can increase post-operative swelling).   These exercises can be done on a training (exercise) mat, on the floor, on a table or on a bed. Use whatever works the best and is most comfortable for you.    Use music or television while you are exercising so that the exercises are a pleasant break in your day. This will make your life better with the exercises acting as a break in your routine that you can look forward to.   Perform all exercises  about fifteen times, three times per day or as directed.  You should exercise both the operative leg and the other leg as well.   Exercises include:   Quad Sets - Tighten up the muscle on the front of the thigh (Quad) and hold for 5-10 seconds.   Straight Leg Raises - With your knee straight (if you were given a brace, keep it on), lift the leg to 60 degrees, hold for 3 seconds, and slowly lower the leg.  Perform this exercise against resistance later as your leg gets stronger.  Leg Slides: Lying on your back, slowly slide your foot toward your buttocks, bending your knee up off the floor (only go as far as is comfortable). Then slowly slide your foot back down until your leg is flat on the floor again.  Angel Wings: Lying on your back spread your legs to the side as far apart as you can without causing discomfort.  Hamstring Strength:  Lying on your back, push your heel  against the floor with your leg straight by tightening up the muscles of your buttocks.  Repeat, but this time bend your knee to a comfortable angle, and push your heel against the floor.  You may put a pillow under the heel to make it more comfortable if necessary.   A rehabilitation program following joint replacement surgery can speed recovery and prevent re-injury in the future due to weakened muscles. Contact your doctor or a physical therapist for more information on knee rehabilitation.    CONSTIPATION  Constipation is defined medically as fewer than three stools per week and severe constipation as less than one stool per week.  Even if you have a regular bowel pattern at home, your normal regimen is likely to be disrupted due to multiple reasons following surgery.  Combination of anesthesia, postoperative narcotics, change in appetite and fluid intake all can affect your bowels.   YOU MUST use at least one of the following options; they are listed in order of increasing strength to get the job done.  They are all available over the counter, and you may need to use some, POSSIBLY even all of these options:    Drink plenty of fluids (prune juice may be helpful) and high fiber foods Colace 100 mg by mouth twice a day  Senokot for constipation as directed and as needed Dulcolax (bisacodyl), take with full glass of water  Miralax (polyethylene glycol) once or twice a day as needed.  If you have tried all these things and are unable to have a bowel movement in the first 3-4 days after surgery call either your surgeon or your primary doctor.    If you experience loose stools or diarrhea, hold the medications until you stool forms back up.  If your symptoms do not get better within 1 week or if they get worse, check with your doctor.  If you experience "the worst abdominal pain ever" or develop nausea or vomiting, please contact the office immediately for further recommendations for  treatment.   ITCHING:  If you experience itching with your medications, try taking only a single pain pill, or even half a pain pill at a time.  You can also use Benadryl over the counter for itching or also to help with sleep.   TED HOSE STOCKINGS:  Use stockings on both legs until for at least 2 weeks or as directed by physician office. They may be removed at night for sleeping.  MEDICATIONS:  See your medication  summary on the "After Visit Summary" that nursing will review with you.  You may have some home medications which will be placed on hold until you complete the course of blood thinner medication.  It is important for you to complete the blood thinner medication as prescribed.  PRECAUTIONS:  If you experience chest pain or shortness of breath - call 911 immediately for transfer to the hospital emergency department.   If you develop a fever greater that 101 F, purulent drainage from wound, increased redness or drainage from wound, foul odor from the wound/dressing, or calf pain - CONTACT YOUR SURGEON.                                                   FOLLOW-UP APPOINTMENTS:  If you do not already have a post-op appointment, please call the office for an appointment to be seen by your surgeon.  Guidelines for how soon to be seen are listed in your "After Visit Summary", but are typically between 1-4 weeks after surgery.  OTHER INSTRUCTIONS:   Knee Replacement:  Do not place pillow under knee, focus on keeping the knee straight while resting. CPM instructions: 0-90 degrees, 2 hours in the morning, 2 hours in the afternoon, and 2 hours in the evening. Place foam block, curve side up under heel at all times except when in CPM or when walking.  DO NOT modify, tear, cut, or change the foam block in any way.  MAKE SURE YOU:  Understand these instructions.  Get help right away if you are not doing well or get worse.    Thank you for letting us be a part of your medical care team.  It is a  privilege we respect greatly.  We hope these instructions will help you stay on track for a fast and full recovery!   Increase activity slowly as tolerated    Complete by:  As directed       DISCHARGE MEDICATIONS:   Allergies as of 10/28/2016   No Known Allergies     Medication List    STOP taking these medications   ibuprofen 200 MG tablet Commonly known as:  ADVIL,MOTRIN     TAKE these medications   aspirin 325 MG EC tablet Take 1 tablet (325 mg total) by mouth 2 (two) times daily.   methocarbamol 500 MG tablet Commonly known as:  ROBAXIN Take 1-2 tablets (500-1,000 mg total) by mouth every 6 (six) hours as needed for muscle spasms.   Oxycodone HCl 10 MG Tabs Take 1 tablet (10 mg total) by mouth every 4 (four) hours as needed for breakthrough pain.   traMADol 50 MG tablet Commonly known as:  ULTRAM Take 50 mg by mouth every 6 (six) hours as needed for moderate pain.            Durable Medical Equipment        Start     Ordered   10/27/16 1459  DME Walker rolling  Once    Question:  Patient needs a walker to treat with the following condition  Answer:  S/P total knee replacement   10/27/16 1458   10/27/16 1459  DME 3 n 1  Once     10/27/16 1458   10/27/16 1459  DME Bedside commode  Once    Question:  Patient needs a bedside  commode to treat with the following condition  Answer:  S/P total knee replacement   10/27/16 1458      FOLLOW UP VISIT:    DISPOSITION: HOME VS. SNF  CONDITION:  Good   Guy Sandifer 10/28/2016, 7:10 AM

## 2016-10-30 DIAGNOSIS — F17210 Nicotine dependence, cigarettes, uncomplicated: Secondary | ICD-10-CM | POA: Diagnosis not present

## 2016-10-30 DIAGNOSIS — Z471 Aftercare following joint replacement surgery: Secondary | ICD-10-CM | POA: Diagnosis not present

## 2016-10-30 DIAGNOSIS — Z96653 Presence of artificial knee joint, bilateral: Secondary | ICD-10-CM | POA: Diagnosis not present

## 2016-10-30 DIAGNOSIS — F329 Major depressive disorder, single episode, unspecified: Secondary | ICD-10-CM | POA: Diagnosis not present

## 2016-10-31 DIAGNOSIS — Z96653 Presence of artificial knee joint, bilateral: Secondary | ICD-10-CM | POA: Diagnosis not present

## 2016-10-31 DIAGNOSIS — F17210 Nicotine dependence, cigarettes, uncomplicated: Secondary | ICD-10-CM | POA: Diagnosis not present

## 2016-10-31 DIAGNOSIS — F329 Major depressive disorder, single episode, unspecified: Secondary | ICD-10-CM | POA: Diagnosis not present

## 2016-10-31 DIAGNOSIS — Z471 Aftercare following joint replacement surgery: Secondary | ICD-10-CM | POA: Diagnosis not present

## 2016-11-01 DIAGNOSIS — Z471 Aftercare following joint replacement surgery: Secondary | ICD-10-CM | POA: Diagnosis not present

## 2016-11-01 DIAGNOSIS — F329 Major depressive disorder, single episode, unspecified: Secondary | ICD-10-CM | POA: Diagnosis not present

## 2016-11-01 DIAGNOSIS — Z96653 Presence of artificial knee joint, bilateral: Secondary | ICD-10-CM | POA: Diagnosis not present

## 2016-11-01 DIAGNOSIS — F17210 Nicotine dependence, cigarettes, uncomplicated: Secondary | ICD-10-CM | POA: Diagnosis not present

## 2016-11-03 DIAGNOSIS — F17210 Nicotine dependence, cigarettes, uncomplicated: Secondary | ICD-10-CM | POA: Diagnosis not present

## 2016-11-03 DIAGNOSIS — Z471 Aftercare following joint replacement surgery: Secondary | ICD-10-CM | POA: Diagnosis not present

## 2016-11-03 DIAGNOSIS — Z96653 Presence of artificial knee joint, bilateral: Secondary | ICD-10-CM | POA: Diagnosis not present

## 2016-11-03 DIAGNOSIS — F329 Major depressive disorder, single episode, unspecified: Secondary | ICD-10-CM | POA: Diagnosis not present

## 2016-11-05 DIAGNOSIS — F329 Major depressive disorder, single episode, unspecified: Secondary | ICD-10-CM | POA: Diagnosis not present

## 2016-11-05 DIAGNOSIS — Z471 Aftercare following joint replacement surgery: Secondary | ICD-10-CM | POA: Diagnosis not present

## 2016-11-05 DIAGNOSIS — Z96653 Presence of artificial knee joint, bilateral: Secondary | ICD-10-CM | POA: Diagnosis not present

## 2016-11-05 DIAGNOSIS — M25562 Pain in left knee: Secondary | ICD-10-CM | POA: Diagnosis not present

## 2016-11-05 DIAGNOSIS — Z96652 Presence of left artificial knee joint: Secondary | ICD-10-CM | POA: Diagnosis not present

## 2016-11-05 DIAGNOSIS — M25462 Effusion, left knee: Secondary | ICD-10-CM | POA: Diagnosis not present

## 2016-11-05 DIAGNOSIS — F17210 Nicotine dependence, cigarettes, uncomplicated: Secondary | ICD-10-CM | POA: Diagnosis not present

## 2016-11-11 DIAGNOSIS — Z96652 Presence of left artificial knee joint: Secondary | ICD-10-CM | POA: Diagnosis not present

## 2016-11-11 DIAGNOSIS — Z471 Aftercare following joint replacement surgery: Secondary | ICD-10-CM | POA: Diagnosis not present

## 2016-11-11 DIAGNOSIS — R2689 Other abnormalities of gait and mobility: Secondary | ICD-10-CM | POA: Diagnosis not present

## 2016-11-11 DIAGNOSIS — M25662 Stiffness of left knee, not elsewhere classified: Secondary | ICD-10-CM | POA: Diagnosis not present

## 2016-11-11 DIAGNOSIS — M6281 Muscle weakness (generalized): Secondary | ICD-10-CM | POA: Diagnosis not present

## 2016-11-14 DIAGNOSIS — R2689 Other abnormalities of gait and mobility: Secondary | ICD-10-CM | POA: Diagnosis not present

## 2016-11-14 DIAGNOSIS — M25662 Stiffness of left knee, not elsewhere classified: Secondary | ICD-10-CM | POA: Diagnosis not present

## 2016-11-14 DIAGNOSIS — M6281 Muscle weakness (generalized): Secondary | ICD-10-CM | POA: Diagnosis not present

## 2016-11-14 DIAGNOSIS — Z471 Aftercare following joint replacement surgery: Secondary | ICD-10-CM | POA: Diagnosis not present

## 2016-11-14 DIAGNOSIS — Z96652 Presence of left artificial knee joint: Secondary | ICD-10-CM | POA: Diagnosis not present

## 2016-11-17 DIAGNOSIS — M6281 Muscle weakness (generalized): Secondary | ICD-10-CM | POA: Diagnosis not present

## 2016-11-17 DIAGNOSIS — Z471 Aftercare following joint replacement surgery: Secondary | ICD-10-CM | POA: Diagnosis not present

## 2016-11-17 DIAGNOSIS — Z96652 Presence of left artificial knee joint: Secondary | ICD-10-CM | POA: Diagnosis not present

## 2016-11-17 DIAGNOSIS — M25662 Stiffness of left knee, not elsewhere classified: Secondary | ICD-10-CM | POA: Diagnosis not present

## 2016-11-17 DIAGNOSIS — R2689 Other abnormalities of gait and mobility: Secondary | ICD-10-CM | POA: Diagnosis not present

## 2016-11-19 DIAGNOSIS — Z471 Aftercare following joint replacement surgery: Secondary | ICD-10-CM | POA: Diagnosis not present

## 2016-11-19 DIAGNOSIS — M25662 Stiffness of left knee, not elsewhere classified: Secondary | ICD-10-CM | POA: Diagnosis not present

## 2016-11-19 DIAGNOSIS — R2689 Other abnormalities of gait and mobility: Secondary | ICD-10-CM | POA: Diagnosis not present

## 2016-11-19 DIAGNOSIS — M6281 Muscle weakness (generalized): Secondary | ICD-10-CM | POA: Diagnosis not present

## 2016-11-19 DIAGNOSIS — Z96652 Presence of left artificial knee joint: Secondary | ICD-10-CM | POA: Diagnosis not present

## 2016-11-25 DIAGNOSIS — Z96652 Presence of left artificial knee joint: Secondary | ICD-10-CM | POA: Diagnosis not present

## 2016-11-25 DIAGNOSIS — M25662 Stiffness of left knee, not elsewhere classified: Secondary | ICD-10-CM | POA: Diagnosis not present

## 2016-11-25 DIAGNOSIS — Z471 Aftercare following joint replacement surgery: Secondary | ICD-10-CM | POA: Diagnosis not present

## 2016-11-25 DIAGNOSIS — M6281 Muscle weakness (generalized): Secondary | ICD-10-CM | POA: Diagnosis not present

## 2016-11-25 DIAGNOSIS — R2689 Other abnormalities of gait and mobility: Secondary | ICD-10-CM | POA: Diagnosis not present

## 2016-11-27 DIAGNOSIS — R2689 Other abnormalities of gait and mobility: Secondary | ICD-10-CM | POA: Diagnosis not present

## 2016-11-27 DIAGNOSIS — M25662 Stiffness of left knee, not elsewhere classified: Secondary | ICD-10-CM | POA: Diagnosis not present

## 2016-11-27 DIAGNOSIS — M6281 Muscle weakness (generalized): Secondary | ICD-10-CM | POA: Diagnosis not present

## 2016-11-27 DIAGNOSIS — Z471 Aftercare following joint replacement surgery: Secondary | ICD-10-CM | POA: Diagnosis not present

## 2016-11-27 DIAGNOSIS — Z96652 Presence of left artificial knee joint: Secondary | ICD-10-CM | POA: Diagnosis not present

## 2016-12-02 DIAGNOSIS — Z96652 Presence of left artificial knee joint: Secondary | ICD-10-CM | POA: Diagnosis not present

## 2016-12-02 DIAGNOSIS — M6281 Muscle weakness (generalized): Secondary | ICD-10-CM | POA: Diagnosis not present

## 2016-12-02 DIAGNOSIS — M25662 Stiffness of left knee, not elsewhere classified: Secondary | ICD-10-CM | POA: Diagnosis not present

## 2016-12-02 DIAGNOSIS — Z471 Aftercare following joint replacement surgery: Secondary | ICD-10-CM | POA: Diagnosis not present

## 2016-12-02 DIAGNOSIS — R2689 Other abnormalities of gait and mobility: Secondary | ICD-10-CM | POA: Diagnosis not present

## 2017-02-04 DIAGNOSIS — M25662 Stiffness of left knee, not elsewhere classified: Secondary | ICD-10-CM | POA: Diagnosis not present

## 2017-02-04 DIAGNOSIS — R2689 Other abnormalities of gait and mobility: Secondary | ICD-10-CM | POA: Diagnosis not present

## 2017-02-04 DIAGNOSIS — Z96652 Presence of left artificial knee joint: Secondary | ICD-10-CM | POA: Diagnosis not present

## 2017-02-04 DIAGNOSIS — M6281 Muscle weakness (generalized): Secondary | ICD-10-CM | POA: Diagnosis not present

## 2017-02-04 DIAGNOSIS — Z471 Aftercare following joint replacement surgery: Secondary | ICD-10-CM | POA: Diagnosis not present

## 2017-03-04 DIAGNOSIS — N529 Male erectile dysfunction, unspecified: Secondary | ICD-10-CM | POA: Diagnosis not present

## 2017-03-04 DIAGNOSIS — M1990 Unspecified osteoarthritis, unspecified site: Secondary | ICD-10-CM | POA: Diagnosis not present

## 2017-03-04 DIAGNOSIS — Z0000 Encounter for general adult medical examination without abnormal findings: Secondary | ICD-10-CM | POA: Diagnosis not present

## 2017-03-04 DIAGNOSIS — N3943 Post-void dribbling: Secondary | ICD-10-CM | POA: Diagnosis not present

## 2017-03-04 DIAGNOSIS — B009 Herpesviral infection, unspecified: Secondary | ICD-10-CM | POA: Diagnosis not present

## 2017-03-04 DIAGNOSIS — Z1211 Encounter for screening for malignant neoplasm of colon: Secondary | ICD-10-CM | POA: Diagnosis not present

## 2017-03-04 DIAGNOSIS — Z125 Encounter for screening for malignant neoplasm of prostate: Secondary | ICD-10-CM | POA: Diagnosis not present

## 2017-03-04 DIAGNOSIS — E7800 Pure hypercholesterolemia, unspecified: Secondary | ICD-10-CM | POA: Diagnosis not present

## 2017-03-04 DIAGNOSIS — Z1159 Encounter for screening for other viral diseases: Secondary | ICD-10-CM | POA: Diagnosis not present

## 2017-09-09 DIAGNOSIS — E7800 Pure hypercholesterolemia, unspecified: Secondary | ICD-10-CM | POA: Diagnosis not present

## 2017-11-02 DIAGNOSIS — M549 Dorsalgia, unspecified: Secondary | ICD-10-CM | POA: Diagnosis not present

## 2017-11-02 DIAGNOSIS — S299XXA Unspecified injury of thorax, initial encounter: Secondary | ICD-10-CM | POA: Diagnosis not present

## 2017-11-02 DIAGNOSIS — M542 Cervicalgia: Secondary | ICD-10-CM | POA: Diagnosis not present

## 2017-11-02 DIAGNOSIS — S79911A Unspecified injury of right hip, initial encounter: Secondary | ICD-10-CM | POA: Diagnosis not present

## 2017-11-02 DIAGNOSIS — S161XXA Strain of muscle, fascia and tendon at neck level, initial encounter: Secondary | ICD-10-CM | POA: Diagnosis not present

## 2017-11-02 DIAGNOSIS — F17200 Nicotine dependence, unspecified, uncomplicated: Secondary | ICD-10-CM | POA: Diagnosis not present

## 2017-11-02 DIAGNOSIS — M546 Pain in thoracic spine: Secondary | ICD-10-CM | POA: Diagnosis not present

## 2017-11-02 DIAGNOSIS — M545 Low back pain: Secondary | ICD-10-CM | POA: Diagnosis not present

## 2017-11-02 DIAGNOSIS — M25551 Pain in right hip: Secondary | ICD-10-CM | POA: Diagnosis not present

## 2017-11-02 DIAGNOSIS — S0990XA Unspecified injury of head, initial encounter: Secondary | ICD-10-CM | POA: Diagnosis not present

## 2017-11-02 DIAGNOSIS — R51 Headache: Secondary | ICD-10-CM | POA: Diagnosis not present

## 2017-11-02 DIAGNOSIS — R079 Chest pain, unspecified: Secondary | ICD-10-CM | POA: Diagnosis not present

## 2017-11-02 DIAGNOSIS — S199XXA Unspecified injury of neck, initial encounter: Secondary | ICD-10-CM | POA: Diagnosis not present

## 2017-11-02 DIAGNOSIS — S3992XA Unspecified injury of lower back, initial encounter: Secondary | ICD-10-CM | POA: Diagnosis not present

## 2018-03-05 DIAGNOSIS — G4700 Insomnia, unspecified: Secondary | ICD-10-CM | POA: Diagnosis not present

## 2018-03-05 DIAGNOSIS — N529 Male erectile dysfunction, unspecified: Secondary | ICD-10-CM | POA: Diagnosis not present

## 2018-03-05 DIAGNOSIS — Z125 Encounter for screening for malignant neoplasm of prostate: Secondary | ICD-10-CM | POA: Diagnosis not present

## 2018-03-05 DIAGNOSIS — B009 Herpesviral infection, unspecified: Secondary | ICD-10-CM | POA: Diagnosis not present

## 2018-03-05 DIAGNOSIS — K429 Umbilical hernia without obstruction or gangrene: Secondary | ICD-10-CM | POA: Diagnosis not present

## 2018-03-05 DIAGNOSIS — E7800 Pure hypercholesterolemia, unspecified: Secondary | ICD-10-CM | POA: Diagnosis not present

## 2018-03-05 DIAGNOSIS — Z0000 Encounter for general adult medical examination without abnormal findings: Secondary | ICD-10-CM | POA: Diagnosis not present

## 2018-03-05 DIAGNOSIS — Z23 Encounter for immunization: Secondary | ICD-10-CM | POA: Diagnosis not present

## 2018-03-05 DIAGNOSIS — M1990 Unspecified osteoarthritis, unspecified site: Secondary | ICD-10-CM | POA: Diagnosis not present

## 2018-09-02 DIAGNOSIS — M542 Cervicalgia: Secondary | ICD-10-CM | POA: Diagnosis not present

## 2018-12-23 DIAGNOSIS — Z23 Encounter for immunization: Secondary | ICD-10-CM | POA: Diagnosis not present

## 2019-03-11 DIAGNOSIS — G4700 Insomnia, unspecified: Secondary | ICD-10-CM | POA: Diagnosis not present

## 2019-03-11 DIAGNOSIS — E7800 Pure hypercholesterolemia, unspecified: Secondary | ICD-10-CM | POA: Diagnosis not present

## 2019-03-11 DIAGNOSIS — Z125 Encounter for screening for malignant neoplasm of prostate: Secondary | ICD-10-CM | POA: Diagnosis not present

## 2019-03-11 DIAGNOSIS — N529 Male erectile dysfunction, unspecified: Secondary | ICD-10-CM | POA: Diagnosis not present

## 2019-03-11 DIAGNOSIS — Z1211 Encounter for screening for malignant neoplasm of colon: Secondary | ICD-10-CM | POA: Diagnosis not present

## 2019-03-11 DIAGNOSIS — Z0000 Encounter for general adult medical examination without abnormal findings: Secondary | ICD-10-CM | POA: Diagnosis not present

## 2019-03-17 DIAGNOSIS — E7800 Pure hypercholesterolemia, unspecified: Secondary | ICD-10-CM | POA: Diagnosis not present

## 2019-03-17 DIAGNOSIS — Z1211 Encounter for screening for malignant neoplasm of colon: Secondary | ICD-10-CM | POA: Diagnosis not present

## 2019-03-17 DIAGNOSIS — Z125 Encounter for screening for malignant neoplasm of prostate: Secondary | ICD-10-CM | POA: Diagnosis not present

## 2019-04-09 DIAGNOSIS — S0181XA Laceration without foreign body of other part of head, initial encounter: Secondary | ICD-10-CM | POA: Diagnosis not present

## 2019-04-09 DIAGNOSIS — I10 Essential (primary) hypertension: Secondary | ICD-10-CM | POA: Diagnosis not present

## 2019-04-09 DIAGNOSIS — Z5329 Procedure and treatment not carried out because of patient's decision for other reasons: Secondary | ICD-10-CM | POA: Diagnosis not present

## 2019-04-09 DIAGNOSIS — R58 Hemorrhage, not elsewhere classified: Secondary | ICD-10-CM | POA: Diagnosis not present

## 2019-04-09 DIAGNOSIS — R4020 Unspecified coma: Secondary | ICD-10-CM | POA: Diagnosis not present

## 2019-04-09 DIAGNOSIS — F17200 Nicotine dependence, unspecified, uncomplicated: Secondary | ICD-10-CM | POA: Diagnosis not present

## 2019-04-09 DIAGNOSIS — R404 Transient alteration of awareness: Secondary | ICD-10-CM | POA: Diagnosis not present

## 2019-04-10 DIAGNOSIS — F17200 Nicotine dependence, unspecified, uncomplicated: Secondary | ICD-10-CM | POA: Diagnosis not present

## 2019-04-10 DIAGNOSIS — S0101XA Laceration without foreign body of scalp, initial encounter: Secondary | ICD-10-CM | POA: Diagnosis not present

## 2019-04-10 DIAGNOSIS — S0990XA Unspecified injury of head, initial encounter: Secondary | ICD-10-CM | POA: Diagnosis not present

## 2019-04-10 DIAGNOSIS — S0993XA Unspecified injury of face, initial encounter: Secondary | ICD-10-CM | POA: Diagnosis not present

## 2019-04-10 DIAGNOSIS — S01511A Laceration without foreign body of lip, initial encounter: Secondary | ICD-10-CM | POA: Diagnosis not present

## 2019-04-23 DIAGNOSIS — I10 Essential (primary) hypertension: Secondary | ICD-10-CM | POA: Diagnosis not present

## 2019-12-07 DIAGNOSIS — L309 Dermatitis, unspecified: Secondary | ICD-10-CM | POA: Diagnosis not present

## 2019-12-07 DIAGNOSIS — M25562 Pain in left knee: Secondary | ICD-10-CM | POA: Diagnosis not present

## 2019-12-07 DIAGNOSIS — M25551 Pain in right hip: Secondary | ICD-10-CM | POA: Diagnosis not present

## 2020-01-17 DIAGNOSIS — J069 Acute upper respiratory infection, unspecified: Secondary | ICD-10-CM | POA: Diagnosis not present

## 2020-01-17 DIAGNOSIS — I10 Essential (primary) hypertension: Secondary | ICD-10-CM | POA: Diagnosis not present

## 2020-03-13 ENCOUNTER — Other Ambulatory Visit: Payer: Self-pay | Admitting: Family Medicine

## 2020-03-13 ENCOUNTER — Ambulatory Visit
Admission: RE | Admit: 2020-03-13 | Discharge: 2020-03-13 | Disposition: A | Discharge status: Home / Self Care | Payer: Medicare HMO | Source: Ambulatory Visit | Attending: Family Medicine | Admitting: Family Medicine

## 2020-03-13 DIAGNOSIS — R0989 Other specified symptoms and signs involving the circulatory and respiratory systems: Secondary | ICD-10-CM

## 2020-03-13 DIAGNOSIS — R918 Other nonspecific abnormal finding of lung field: Secondary | ICD-10-CM | POA: Diagnosis not present

## 2020-05-08 DIAGNOSIS — R059 Cough, unspecified: Secondary | ICD-10-CM | POA: Diagnosis not present

## 2020-05-21 DIAGNOSIS — E7800 Pure hypercholesterolemia, unspecified: Secondary | ICD-10-CM | POA: Diagnosis not present

## 2020-05-23 ENCOUNTER — Other Ambulatory Visit: Payer: Self-pay

## 2020-05-23 DIAGNOSIS — R059 Cough, unspecified: Secondary | ICD-10-CM

## 2020-05-23 DIAGNOSIS — R0989 Other specified symptoms and signs involving the circulatory and respiratory systems: Secondary | ICD-10-CM

## 2020-05-24 ENCOUNTER — Ambulatory Visit
Admission: RE | Admit: 2020-05-24 | Discharge: 2020-05-24 | Disposition: A | Discharge status: Home / Self Care | Payer: Medicare HMO | Source: Ambulatory Visit | Attending: Family Medicine | Admitting: Family Medicine

## 2020-05-24 DIAGNOSIS — I2510 Atherosclerotic heart disease of native coronary artery without angina pectoris: Secondary | ICD-10-CM | POA: Diagnosis not present

## 2020-05-24 DIAGNOSIS — R918 Other nonspecific abnormal finding of lung field: Secondary | ICD-10-CM | POA: Diagnosis not present

## 2020-05-24 DIAGNOSIS — J432 Centrilobular emphysema: Secondary | ICD-10-CM | POA: Diagnosis not present

## 2020-05-24 DIAGNOSIS — R0989 Other specified symptoms and signs involving the circulatory and respiratory systems: Secondary | ICD-10-CM

## 2020-05-24 DIAGNOSIS — R059 Cough, unspecified: Secondary | ICD-10-CM

## 2020-05-24 MED ORDER — IOPAMIDOL (ISOVUE-300) INJECTION 61%
75.0000 mL | Freq: Once | INTRAVENOUS | Status: AC | PRN
  Administered 2020-05-24: 75 mL via INTRAVENOUS

## 2020-09-20 DIAGNOSIS — H04201 Unspecified epiphora, right lacrimal gland: Secondary | ICD-10-CM | POA: Diagnosis not present

## 2020-09-20 DIAGNOSIS — N529 Male erectile dysfunction, unspecified: Secondary | ICD-10-CM | POA: Diagnosis not present

## 2020-09-25 DIAGNOSIS — H1031 Unspecified acute conjunctivitis, right eye: Secondary | ICD-10-CM | POA: Diagnosis not present

## 2021-01-14 DIAGNOSIS — Z0100 Encounter for examination of eyes and vision without abnormal findings: Secondary | ICD-10-CM | POA: Diagnosis not present

## 2021-01-14 DIAGNOSIS — H5200 Hypermetropia, unspecified eye: Secondary | ICD-10-CM | POA: Diagnosis not present

## 2021-02-01 DIAGNOSIS — M5416 Radiculopathy, lumbar region: Secondary | ICD-10-CM | POA: Diagnosis not present

## 2021-02-01 DIAGNOSIS — M79604 Pain in right leg: Secondary | ICD-10-CM | POA: Diagnosis not present

## 2021-02-07 DIAGNOSIS — M79604 Pain in right leg: Secondary | ICD-10-CM | POA: Diagnosis not present

## 2021-02-07 DIAGNOSIS — M5416 Radiculopathy, lumbar region: Secondary | ICD-10-CM | POA: Diagnosis not present

## 2021-02-13 DIAGNOSIS — M5416 Radiculopathy, lumbar region: Secondary | ICD-10-CM | POA: Diagnosis not present

## 2021-02-13 DIAGNOSIS — M79604 Pain in right leg: Secondary | ICD-10-CM | POA: Diagnosis not present

## 2021-02-15 DIAGNOSIS — M79604 Pain in right leg: Secondary | ICD-10-CM | POA: Diagnosis not present

## 2021-02-15 DIAGNOSIS — M5416 Radiculopathy, lumbar region: Secondary | ICD-10-CM | POA: Diagnosis not present

## 2021-02-20 DIAGNOSIS — M79604 Pain in right leg: Secondary | ICD-10-CM | POA: Diagnosis not present

## 2021-02-20 DIAGNOSIS — M5416 Radiculopathy, lumbar region: Secondary | ICD-10-CM | POA: Diagnosis not present

## 2021-02-26 DIAGNOSIS — Z23 Encounter for immunization: Secondary | ICD-10-CM | POA: Diagnosis not present

## 2021-02-27 DIAGNOSIS — M5416 Radiculopathy, lumbar region: Secondary | ICD-10-CM | POA: Diagnosis not present

## 2021-02-27 DIAGNOSIS — M79604 Pain in right leg: Secondary | ICD-10-CM | POA: Diagnosis not present

## 2021-03-05 DIAGNOSIS — E7800 Pure hypercholesterolemia, unspecified: Secondary | ICD-10-CM | POA: Diagnosis not present

## 2021-03-05 DIAGNOSIS — L723 Sebaceous cyst: Secondary | ICD-10-CM | POA: Diagnosis not present

## 2021-03-05 DIAGNOSIS — Z125 Encounter for screening for malignant neoplasm of prostate: Secondary | ICD-10-CM | POA: Diagnosis not present

## 2021-03-05 DIAGNOSIS — Z0000 Encounter for general adult medical examination without abnormal findings: Secondary | ICD-10-CM | POA: Diagnosis not present

## 2021-03-05 DIAGNOSIS — N529 Male erectile dysfunction, unspecified: Secondary | ICD-10-CM | POA: Diagnosis not present

## 2021-03-05 DIAGNOSIS — I700 Atherosclerosis of aorta: Secondary | ICD-10-CM | POA: Diagnosis not present

## 2021-03-05 DIAGNOSIS — I10 Essential (primary) hypertension: Secondary | ICD-10-CM | POA: Diagnosis not present

## 2021-03-08 DIAGNOSIS — M79604 Pain in right leg: Secondary | ICD-10-CM | POA: Diagnosis not present

## 2021-03-08 DIAGNOSIS — M5416 Radiculopathy, lumbar region: Secondary | ICD-10-CM | POA: Diagnosis not present

## 2021-03-29 DIAGNOSIS — R0981 Nasal congestion: Secondary | ICD-10-CM | POA: Diagnosis not present

## 2021-03-29 DIAGNOSIS — J019 Acute sinusitis, unspecified: Secondary | ICD-10-CM | POA: Diagnosis not present

## 2021-03-29 DIAGNOSIS — R0982 Postnasal drip: Secondary | ICD-10-CM | POA: Diagnosis not present

## 2021-03-29 DIAGNOSIS — R059 Cough, unspecified: Secondary | ICD-10-CM | POA: Diagnosis not present

## 2021-05-27 DIAGNOSIS — H524 Presbyopia: Secondary | ICD-10-CM | POA: Diagnosis not present

## 2021-07-09 ENCOUNTER — Other Ambulatory Visit: Payer: Self-pay | Admitting: Family Medicine

## 2021-07-09 ENCOUNTER — Other Ambulatory Visit (HOSPITAL_COMMUNITY): Payer: Self-pay | Admitting: Family Medicine

## 2021-07-09 DIAGNOSIS — R42 Dizziness and giddiness: Secondary | ICD-10-CM

## 2021-08-24 ENCOUNTER — Other Ambulatory Visit (HOSPITAL_COMMUNITY): Payer: Self-pay | Admitting: Family Medicine

## 2021-08-24 ENCOUNTER — Other Ambulatory Visit: Payer: Self-pay | Admitting: Family Medicine

## 2021-09-03 DIAGNOSIS — E7800 Pure hypercholesterolemia, unspecified: Secondary | ICD-10-CM | POA: Diagnosis not present

## 2021-09-03 DIAGNOSIS — R42 Dizziness and giddiness: Secondary | ICD-10-CM | POA: Diagnosis not present

## 2021-09-03 DIAGNOSIS — I10 Essential (primary) hypertension: Secondary | ICD-10-CM | POA: Diagnosis not present

## 2021-09-03 DIAGNOSIS — I700 Atherosclerosis of aorta: Secondary | ICD-10-CM | POA: Diagnosis not present

## 2021-09-03 DIAGNOSIS — T148XXA Other injury of unspecified body region, initial encounter: Secondary | ICD-10-CM | POA: Diagnosis not present

## 2021-09-03 DIAGNOSIS — Z1389 Encounter for screening for other disorder: Secondary | ICD-10-CM | POA: Diagnosis not present

## 2021-09-03 DIAGNOSIS — N529 Male erectile dysfunction, unspecified: Secondary | ICD-10-CM | POA: Diagnosis not present

## 2021-09-17 ENCOUNTER — Ambulatory Visit (HOSPITAL_COMMUNITY)
Admission: RE | Admit: 2021-09-17 | Discharge: 2021-09-17 | Disposition: A | Discharge status: Home / Self Care | Payer: No Typology Code available for payment source | Source: Ambulatory Visit | Attending: Family Medicine | Admitting: Family Medicine

## 2021-09-17 DIAGNOSIS — R42 Dizziness and giddiness: Secondary | ICD-10-CM | POA: Insufficient documentation

## 2021-09-17 MED ORDER — GADOBUTROL 1 MMOL/ML IV SOLN
10.0000 mL | Freq: Once | INTRAVENOUS | Status: AC | PRN
  Administered 2021-09-17: 10 mL via INTRAVENOUS

## 2021-11-20 ENCOUNTER — Other Ambulatory Visit (HOSPITAL_COMMUNITY): Payer: Self-pay | Admitting: Gerontology

## 2021-11-20 ENCOUNTER — Other Ambulatory Visit (HOSPITAL_COMMUNITY): Payer: Self-pay | Admitting: Family Medicine

## 2021-11-20 DIAGNOSIS — T148XXA Other injury of unspecified body region, initial encounter: Secondary | ICD-10-CM

## 2022-01-01 DIAGNOSIS — Z23 Encounter for immunization: Secondary | ICD-10-CM | POA: Diagnosis not present

## 2022-01-01 DIAGNOSIS — K1120 Sialoadenitis, unspecified: Secondary | ICD-10-CM | POA: Diagnosis not present

## 2022-01-28 DIAGNOSIS — M795 Residual foreign body in soft tissue: Secondary | ICD-10-CM | POA: Diagnosis not present

## 2022-02-05 ENCOUNTER — Encounter (HOSPITAL_BASED_OUTPATIENT_CLINIC_OR_DEPARTMENT_OTHER): Payer: Self-pay | Admitting: Orthopedic Surgery

## 2022-02-05 ENCOUNTER — Other Ambulatory Visit: Payer: Self-pay | Admitting: Orthopedic Surgery

## 2022-02-05 ENCOUNTER — Other Ambulatory Visit: Payer: Self-pay

## 2022-02-05 DIAGNOSIS — M795 Residual foreign body in soft tissue: Secondary | ICD-10-CM | POA: Diagnosis not present

## 2022-02-05 DIAGNOSIS — M79642 Pain in left hand: Secondary | ICD-10-CM | POA: Diagnosis not present

## 2022-02-05 DIAGNOSIS — M79641 Pain in right hand: Secondary | ICD-10-CM | POA: Diagnosis not present

## 2022-02-06 ENCOUNTER — Ambulatory Visit (HOSPITAL_BASED_OUTPATIENT_CLINIC_OR_DEPARTMENT_OTHER): Payer: No Typology Code available for payment source | Admitting: Anesthesiology

## 2022-02-06 ENCOUNTER — Encounter (HOSPITAL_BASED_OUTPATIENT_CLINIC_OR_DEPARTMENT_OTHER): Payer: Self-pay | Admitting: Orthopedic Surgery

## 2022-02-06 ENCOUNTER — Other Ambulatory Visit: Payer: Self-pay

## 2022-02-06 ENCOUNTER — Ambulatory Visit (HOSPITAL_BASED_OUTPATIENT_CLINIC_OR_DEPARTMENT_OTHER)
Admission: RE | Admit: 2022-02-06 | Discharge: 2022-02-06 | Disposition: A | Discharge status: Home / Self Care | Payer: No Typology Code available for payment source | Attending: Orthopedic Surgery | Admitting: Orthopedic Surgery

## 2022-02-06 ENCOUNTER — Encounter (HOSPITAL_BASED_OUTPATIENT_CLINIC_OR_DEPARTMENT_OTHER)
Admission: RE | Disposition: A | Discharge status: Home / Self Care | Payer: Self-pay | Source: Home / Self Care | Attending: Orthopedic Surgery

## 2022-02-06 DIAGNOSIS — S60352A Superficial foreign body of left thumb, initial encounter: Secondary | ICD-10-CM | POA: Diagnosis not present

## 2022-02-06 DIAGNOSIS — I10 Essential (primary) hypertension: Secondary | ICD-10-CM

## 2022-02-06 DIAGNOSIS — F17210 Nicotine dependence, cigarettes, uncomplicated: Secondary | ICD-10-CM

## 2022-02-06 DIAGNOSIS — S60452A Superficial foreign body of right middle finger, initial encounter: Secondary | ICD-10-CM

## 2022-02-06 DIAGNOSIS — M60241 Foreign body granuloma of soft tissue, not elsewhere classified, right hand: Secondary | ICD-10-CM | POA: Insufficient documentation

## 2022-02-06 DIAGNOSIS — M60242 Foreign body granuloma of soft tissue, not elsewhere classified, left hand: Secondary | ICD-10-CM | POA: Insufficient documentation

## 2022-02-06 DIAGNOSIS — M795 Residual foreign body in soft tissue: Secondary | ICD-10-CM | POA: Diagnosis not present

## 2022-02-06 DIAGNOSIS — F17200 Nicotine dependence, unspecified, uncomplicated: Secondary | ICD-10-CM | POA: Diagnosis not present

## 2022-02-06 DIAGNOSIS — M7989 Other specified soft tissue disorders: Secondary | ICD-10-CM | POA: Diagnosis not present

## 2022-02-06 HISTORY — DX: Essential (primary) hypertension: I10

## 2022-02-06 HISTORY — PX: EXCISION METACARPAL MASS: SHX6372

## 2022-02-06 LAB — BASIC METABOLIC PANEL
Anion gap: 9 (ref 5–15)
BUN: 16 mg/dL (ref 8–23)
CO2: 27 mmol/L (ref 22–32)
Calcium: 9.2 mg/dL (ref 8.9–10.3)
Chloride: 100 mmol/L (ref 98–111)
Creatinine, Ser: 1.25 mg/dL — ABNORMAL HIGH (ref 0.61–1.24)
GFR, Estimated: >60 mL/min (ref >60)
Glucose, Bld: 107 mg/dL — ABNORMAL HIGH (ref 70–99)
Potassium: 3.8 mmol/L (ref 3.5–5.1)
Sodium: 136 mmol/L (ref 135–145)

## 2022-02-06 SURGERY — EXCISION METACARPAL MASS
Anesthesia: General | Site: Hand | Laterality: Bilateral

## 2022-02-06 MED ORDER — FENTANYL CITRATE (PF) 100 MCG/2ML IJ SOLN
INTRAMUSCULAR | Status: DC | PRN
  Administered 2022-02-06 (×2): 25 ug via INTRAVENOUS
  Administered 2022-02-06: 50 ug via INTRAVENOUS

## 2022-02-06 MED ORDER — MEPERIDINE HCL 25 MG/ML IJ SOLN: 6.2500 mg | INTRAMUSCULAR | Status: DC | PRN

## 2022-02-06 MED ORDER — EPHEDRINE 5 MG/ML INJ
INTRAVENOUS | Status: AC
  Filled 2022-02-06: qty 5

## 2022-02-06 MED ORDER — ONDANSETRON HCL 4 MG/2ML IJ SOLN
INTRAMUSCULAR | Status: DC | PRN
  Administered 2022-02-06: 4 mg via INTRAVENOUS

## 2022-02-06 MED ORDER — CEFAZOLIN SODIUM-DEXTROSE 2-3 GM-%(50ML) IV SOLR
INTRAVENOUS | Status: DC | PRN
  Administered 2022-02-06: 2 g via INTRAVENOUS

## 2022-02-06 MED ORDER — ONDANSETRON HCL 4 MG/2ML IJ SOLN: 4.0000 mg | Freq: Once | INTRAMUSCULAR | Status: DC | PRN

## 2022-02-06 MED ORDER — DEXAMETHASONE SODIUM PHOSPHATE 10 MG/ML IJ SOLN
INTRAMUSCULAR | Status: DC | PRN
  Administered 2022-02-06: 5 mg via INTRAVENOUS

## 2022-02-06 MED ORDER — CEFAZOLIN SODIUM-DEXTROSE 2-4 GM/100ML-% IV SOLN: 2.0000 g | INTRAVENOUS | Status: DC

## 2022-02-06 MED ORDER — EPHEDRINE SULFATE-NACL 50-0.9 MG/10ML-% IV SOSY
PREFILLED_SYRINGE | INTRAVENOUS | Status: DC | PRN
  Administered 2022-02-06: 15 mg via INTRAVENOUS

## 2022-02-06 MED ORDER — PROPOFOL 10 MG/ML IV BOLUS
INTRAVENOUS | Status: DC | PRN
  Administered 2022-02-06: 180 mg via INTRAVENOUS

## 2022-02-06 MED ORDER — FENTANYL CITRATE (PF) 100 MCG/2ML IJ SOLN
INTRAMUSCULAR | Status: AC
  Filled 2022-02-06: qty 2

## 2022-02-06 MED ORDER — ACETAMINOPHEN 325 MG PO TABS: 325.0000 mg | ORAL_TABLET | ORAL | Status: DC | PRN

## 2022-02-06 MED ORDER — ACETAMINOPHEN 160 MG/5ML PO SOLN: 325.0000 mg | ORAL | Status: DC | PRN

## 2022-02-06 MED ORDER — OXYCODONE HCL 5 MG/5ML PO SOLN: 5.0000 mg | Freq: Once | ORAL | Status: DC | PRN

## 2022-02-06 MED ORDER — LIDOCAINE 2% (20 MG/ML) 5 ML SYRINGE
INTRAMUSCULAR | Status: AC
  Filled 2022-02-06: qty 5

## 2022-02-06 MED ORDER — LACTATED RINGERS IV SOLN: INTRAVENOUS | Status: DC

## 2022-02-06 MED ORDER — LIDOCAINE HCL (CARDIAC) PF 100 MG/5ML IV SOSY
PREFILLED_SYRINGE | INTRAVENOUS | Status: DC | PRN
  Administered 2022-02-06: 50 mg via INTRAVENOUS

## 2022-02-06 MED ORDER — BUPIVACAINE HCL 0.25 % IJ SOLN
INTRAMUSCULAR | Status: DC | PRN
  Administered 2022-02-06: 9 mL

## 2022-02-06 MED ORDER — DEXAMETHASONE SODIUM PHOSPHATE 10 MG/ML IJ SOLN
INTRAMUSCULAR | Status: AC
  Filled 2022-02-06: qty 1

## 2022-02-06 MED ORDER — MIDAZOLAM HCL 5 MG/5ML IJ SOLN
INTRAMUSCULAR | Status: DC | PRN
  Administered 2022-02-06: 2 mg via INTRAVENOUS

## 2022-02-06 MED ORDER — BUPIVACAINE HCL (PF) 0.25 % IJ SOLN
INTRAMUSCULAR | Status: DC | PRN
  Administered 2022-02-06: 7 mL

## 2022-02-06 MED ORDER — MIDAZOLAM HCL 2 MG/2ML IJ SOLN
INTRAMUSCULAR | Status: AC
  Filled 2022-02-06: qty 2

## 2022-02-06 MED ORDER — ONDANSETRON HCL 4 MG/2ML IJ SOLN
INTRAMUSCULAR | Status: AC
  Filled 2022-02-06: qty 2

## 2022-02-06 MED ORDER — HYDROCODONE-ACETAMINOPHEN 5-325 MG PO TABS: ORAL_TABLET | ORAL | 0 refills | Status: AC

## 2022-02-06 MED ORDER — OXYCODONE HCL 5 MG PO TABS: 5.0000 mg | ORAL_TABLET | Freq: Once | ORAL | Status: DC | PRN

## 2022-02-06 MED ORDER — FENTANYL CITRATE (PF) 100 MCG/2ML IJ SOLN
25.0000 ug | INTRAMUSCULAR | Status: DC | PRN
  Administered 2022-02-06: 25 ug via INTRAVENOUS

## 2022-02-06 SURGICAL SUPPLY — 53 items
APL PRP STRL LF DISP 70% ISPRP (MISCELLANEOUS) ×2
APL SKNCLS STERI-STRIP NONHPOA (GAUZE/BANDAGES/DRESSINGS)
BANDAGE GAUZE 1X75IN STRL (MISCELLANEOUS) IMPLANT
BENZOIN TINCTURE PRP APPL 2/3 (GAUZE/BANDAGES/DRESSINGS) IMPLANT
BLADE MINI RND TIP GREEN BEAV (BLADE) IMPLANT
BLADE SURG 15 STRL LF DISP TIS (BLADE) ×4 IMPLANT
BLADE SURG 15 STRL SS (BLADE) ×4
BNDG CMPR 5X2 CHSV 1 LYR STRL (GAUZE/BANDAGES/DRESSINGS)
BNDG CMPR 75X11 PLY HI ABS (MISCELLANEOUS)
BNDG CMPR 75X21 PLY HI ABS (MISCELLANEOUS)
BNDG CMPR 9X4 STRL LF SNTH (GAUZE/BANDAGES/DRESSINGS)
BNDG COHESIVE 1X5 TAN STRL LF (GAUZE/BANDAGES/DRESSINGS) IMPLANT
BNDG COHESIVE 2X5 TAN ST LF (GAUZE/BANDAGES/DRESSINGS) IMPLANT
BNDG ELASTIC 2X5.8 VLCR STR LF (GAUZE/BANDAGES/DRESSINGS) IMPLANT
BNDG ELASTIC 3X5.8 VLCR STR LF (GAUZE/BANDAGES/DRESSINGS) IMPLANT
BNDG ESMARK 4X9 LF (GAUZE/BANDAGES/DRESSINGS) IMPLANT
BNDG GAUZE 1X75IN STRL (MISCELLANEOUS)
BNDG GAUZE DERMACEA FLUFF 4 (GAUZE/BANDAGES/DRESSINGS) IMPLANT
BNDG GZE DERMACEA 4 6PLY (GAUZE/BANDAGES/DRESSINGS)
BNDG PLASTER X FAST 3X3 WHT LF (CAST SUPPLIES) IMPLANT
BNDG PLSTR 9X3 FST ST WHT (CAST SUPPLIES)
CHLORAPREP W/TINT 26 (MISCELLANEOUS) ×2 IMPLANT
CORD BIPOLAR FORCEPS 12FT (ELECTRODE) ×2 IMPLANT
COVER BACK TABLE 60X90IN (DRAPES) ×2 IMPLANT
COVER MAYO STAND STRL (DRAPES) ×3 IMPLANT
CUFF TOURN SGL QUICK 18X4 (TOURNIQUET CUFF) ×2 IMPLANT
DRAPE EXTREMITY T 121X128X90 (DISPOSABLE) ×3 IMPLANT
DRAPE SURG 17X23 STRL (DRAPES) ×3 IMPLANT
GAUZE SPONGE 4X4 12PLY STRL (GAUZE/BANDAGES/DRESSINGS) ×2 IMPLANT
GAUZE STRETCH 2X75IN STRL (MISCELLANEOUS) IMPLANT
GAUZE XEROFORM 1X8 LF (GAUZE/BANDAGES/DRESSINGS) ×2 IMPLANT
GLOVE BIO SURGEON STRL SZ7.5 (GLOVE) ×2 IMPLANT
GLOVE BIOGEL PI IND STRL 8 (GLOVE) ×2 IMPLANT
GOWN STRL REUS W/ TWL LRG LVL3 (GOWN DISPOSABLE) ×2 IMPLANT
GOWN STRL REUS W/TWL LRG LVL3 (GOWN DISPOSABLE) ×2
GOWN STRL REUS W/TWL XL LVL3 (GOWN DISPOSABLE) ×2 IMPLANT
NDL HYPO 25X1 1.5 SAFETY (NEEDLE) ×1 IMPLANT
NEEDLE HYPO 25X1 1.5 SAFETY (NEEDLE) ×2 IMPLANT
NS IRRIG 1000ML POUR BTL (IV SOLUTION) ×2 IMPLANT
PACK BASIN DAY SURGERY FS (CUSTOM PROCEDURE TRAY) ×2 IMPLANT
PAD CAST 3X4 CTTN HI CHSV (CAST SUPPLIES) IMPLANT
PAD CAST 4YDX4 CTTN HI CHSV (CAST SUPPLIES) IMPLANT
PADDING CAST ABS COTTON 4X4 ST (CAST SUPPLIES) ×2 IMPLANT
PADDING CAST COTTON 3X4 STRL (CAST SUPPLIES)
PADDING CAST COTTON 4X4 STRL (CAST SUPPLIES)
STOCKINETTE 4X48 STRL (DRAPES) ×3 IMPLANT
STRIP CLOSURE SKIN 1/2X4 (GAUZE/BANDAGES/DRESSINGS) IMPLANT
SUT ETHILON 3 0 PS 1 (SUTURE) IMPLANT
SUT ETHILON 4 0 PS 2 18 (SUTURE) ×2 IMPLANT
SYR BULB EAR ULCER 3OZ GRN STR (SYRINGE) ×2 IMPLANT
SYR CONTROL 10ML LL (SYRINGE) ×2 IMPLANT
TOWEL GREEN STERILE FF (TOWEL DISPOSABLE) ×4 IMPLANT
UNDERPAD 30X36 HEAVY ABSORB (UNDERPADS AND DIAPERS) ×2 IMPLANT

## 2022-02-06 NOTE — Op Note (Signed)
I assisted Surgeon(s) and Role:    * Betha Loa, MD - Primary    Cindee Salt, MD - Assisting on the Procedure(s): LEFT THUMB REMOVAL FOREIGN BODY  AND RIGHT LONG FINGER REMOVAL FOREIGN BODY on 02/06/2022.  I provided assistance on this case as follows: setup approach, exploration and closure of each incision and application of the dressings  Electronically signed by: Cindee Salt, MD Date: 02/06/2022 Time: 11:48 AM

## 2022-02-06 NOTE — Op Note (Addendum)
NAME: THAILAND DUBE MEDICAL RECORD NO: [id number] DATE OF BIRTH: 1955-03-26 FACILITY: [facility] LOCATION: [facility] PHYSICIAN: Tami Ribas, MD   OPERATIVE REPORT   DATE OF PROCEDURE: 02/06/22    PREOPERATIVE DIAGNOSIS: Left thumb and ring finger retained foreign body, right long finger retained foreign body   POSTOPERATIVE DIAGNOSIS: Left thumb and ring finger retained foreign body, right long finger retained foreign body   PROCEDURE: 1.  Left thumb excision of foreign body granuloma less than 0.5 cm 2.  Left ring finger exploration 3.  Right long finger excision of foreign body granuloma/scar tissue less than 0.5 cm   SURGEON:  Betha Loa, M.D.   ASSISTANT: Cindee Salt, MD   ANESTHESIA:  General   INTRAVENOUS FLUIDS:  Per anesthesia flow sheet.   ESTIMATED BLOOD LOSS:  Minimal.   COMPLICATIONS:  None.   SPECIMENS: Left thumb foreign body granulomatous pathology   TOURNIQUET TIME:    Total Tourniquet Time Documented: Upper Arm (Left) - 50 minutes Total: Upper Arm (Left) - 50 minutes  Forearm (Right) - 15 minutes Total: Forearm (Right) - 15 minutes    DISPOSITION:  Stable to PACU.   INDICATIONS: 66 year old male states he has gotten pieces of what he thinks are broken up bathtub into the left thumb and ring finger and right long finger.  These are bothersome to him.  Wishes to have them removed.  Risks, benefits and alternatives of surgery were discussed including the risks of blood loss, infection, damage to nerves, vessels, tendons, ligaments, bone for surgery, need for additional surgery, complications with wound healing, continued pain, stiffness, retained foreign body.  He voiced understanding of these risks and elected to proceed.  OPERATIVE COURSE:  After being identified preoperatively by myself,  the patient and I agreed on the procedure and site of the procedure.  The surgical site was marked.  Surgical consent had been signed. Preoperative  IV antibiotic prophylaxis was given. He was transferred to the operating room and placed on the operating table in supine position with the bilateral upper extremities on arm boards.  General anesthesia was induced by the anesthesiologist.  Left upper extremity was prepped and draped in normal sterile orthopedic fashion.  A surgical pause was performed between the surgeons, anesthesia, and operating room staff and all were in agreement as to the patient, procedure, and site of procedure.  Tourniquet at the proximal aspect of the extremity was inflated to 250 mmHg after exsanguination of the arm with an Esmarch bandage.  Siege was made in the thumb pad centrally.  This was carried in subcutaneous tissues by spreading technique.  The area was explored.  Underneath the callused skin there was thickened tissue consistent with scar and foreign body granuloma.  This was carefully freed up using the [facility] blade and sent to pathology for examination.  The wound was explored and no remaining foreign body was noted.  In the ring finger an incision was made in the pad of the finger.  This was again carried in subcutaneous tissues by spreading technique.  The area was explored.  No foreign body was able to be localized.  The wounds were copiously irrigated and closed with 4-0 nylon in a horizontal mattress fashion.  They were dressed with sterile Xeroform 4 x 4's and wrapped with Coban dressing lightly.  Digital block was performed with quarter percent plain Marcaine to aid in postoperative analgesia.  Tourniquet was deflated at 50 minutes.  All fingertips were pink with brisk  capillary refill after deflation of the tourniquet.  The right upper extremity was then prepped and draped in normal sterile orthopedic fashion a surgical pause was again performed between the surgeons anesthesia and operating room staff and all were in agreement as to patient procedure and site procedure.  Incision was made longitudinally over the  distal phalanx of the long finger in the area of the palpable foreign body.  This was carried in subcutaneous tissues by spreading technique.  It was extended proximally in a Bruner type fashion.  There was thickened scar tissue in the area that was palpable.  This was removed.  No noted foreign body was localized.  The area was palpated and no prominence was noted.  The wound was copiously irrigated with sterile saline and closed with 4-0 nylon in a horizontal mattress fashion.  It was dressed with sterile Xeroform and 4 x 4's and wrapped with Coban dressing lightly.  Performed with quarter percent plain Marcaine to aid in postoperative analgesia.  The tourniquet was deflated at 15 minutes.  Fingertips were pink with brisk capillary refill after deflation of tourniquet.  The operative  drapes were broken down.  The patient was awoken from anesthesia safely.  He was transferred back to the stretcher and taken to PACU in stable condition.  I will see him back in the office in 1 week for postoperative followup.  I will give him a prescription for Norco 5/325 1-2 tabs PO q6 hours prn pain, dispense # 20.   Betha Loa, MD Electronically signed, 02/06/22

## 2022-02-06 NOTE — Discharge Instructions (Addendum)

## 2022-02-06 NOTE — Anesthesia Postprocedure Evaluation (Signed)
Anesthesia Post Note  Patient: ULRIC SALZMAN  Procedure(s) Performed: LEFT THUMB REMOVAL FOREIGN BODY  AND RIGHT LONG FINGER REMOVAL FOREIGN BODY (Bilateral: Hand)     Patient location during evaluation: PACU Anesthesia Type: General Level of consciousness: awake and alert Pain management: pain level controlled Vital Signs Assessment: post-procedure vital signs reviewed and stable Respiratory status: spontaneous breathing, nonlabored ventilation, respiratory function stable and patient connected to nasal cannula oxygen Cardiovascular status: blood pressure returned to baseline and stable Postop Assessment: no apparent nausea or vomiting Anesthetic complications: no  No notable events documented.  Last Vitals:  Vitals:   02/06/22 1204 02/06/22 1215  BP:  132/77  Pulse: 76 72  Resp: (!) 23 20  Temp:    SpO2: 100% 100%    Last Pain:  Vitals:   02/06/22 1208  TempSrc:   PainSc: 5                  Shaelynn Dragos

## 2022-02-06 NOTE — Anesthesia Procedure Notes (Signed)
Procedure Name: LMA Insertion Date/Time: 02/06/2022 10:23 AM  Performed by: Lance Coon, CRNAPre-anesthesia Checklist: Patient identified, Emergency Drugs available, Suction available and Patient being monitored Patient Re-evaluated:Patient Re-evaluated prior to induction Oxygen Delivery Method: Circle system utilized Preoxygenation: Pre-oxygenation with 100% oxygen Induction Type: IV induction Ventilation: Mask ventilation without difficulty LMA: LMA inserted LMA Size: 5.0 Number of attempts: 1 Airway Equipment and Method: Bite block Placement Confirmation: positive ETCO2 Tube secured with: Tape Dental Injury: Teeth and Oropharynx as per pre-operative assessment

## 2022-02-06 NOTE — Anesthesia Preprocedure Evaluation (Signed)
Anesthesia Evaluation  Patient identified by MRN, date of birth, ID band Patient awake    Reviewed: Allergy & Precautions, NPO status , Patient's Chart, lab work & pertinent test results  Airway Mallampati: I  TM Distance: >3 FB Neck ROM: Full    Dental no notable dental hx. (+) Poor Dentition, Chipped, Missing, Dental Advisory Given   Pulmonary Current SmokerPatient did not abstain from smoking.   Pulmonary exam normal breath sounds clear to auscultation       Cardiovascular hypertension, Pt. on medications negative cardio ROS Normal cardiovascular exam Rhythm:Regular Rate:Normal     Neuro/Psych negative neurological ROS  negative psych ROS   GI/Hepatic negative GI ROS,,,(+) Hepatitis -  Endo/Other  negative endocrine ROS    Renal/GU negative Renal ROS     Musculoskeletal  (+) Arthritis , Osteoarthritis,    Abdominal   Peds  Hematology negative hematology ROS (+)   Anesthesia Other Findings   Reproductive/Obstetrics                             Anesthesia Physical Anesthesia Plan  ASA: 3  Anesthesia Plan: General   Post-op Pain Management: Minimal or no pain anticipated   Induction: Intravenous  PONV Risk Score and Plan: 1 and Ondansetron, Dexamethasone and Treatment may vary due to age or medical condition  Airway Management Planned: LMA  Additional Equipment: None  Intra-op Plan:   Post-operative Plan:   Informed Consent: I have reviewed the patients History and Physical, chart, labs and discussed the procedure including the risks, benefits and alternatives for the proposed anesthesia with the patient or authorized representative who has indicated his/her understanding and acceptance.     Dental advisory given  Plan Discussed with: CRNA and Anesthesiologist  Anesthesia Plan Comments:         Anesthesia Quick Evaluation

## 2022-02-06 NOTE — H&P (Signed)
  Franklin Pittman is an 66 y.o. male.   Chief Complaint: foreign bodies HPI: 66 yo male with foreign body left thumb and right long finger.  They are bothersome and painful to him.  He wishes to have them removed.  Allergies: No Known Allergies  Past Medical History:  Diagnosis Date   Arthritis    Hepatitis 2014   "c"- took pills    Hypertension     Past Surgical History:  Procedure Laterality Date   boil removed on perineum     JOINT REPLACEMENT Right    16 yrs. 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 No pertinent family history.  Social History:   reports that he has been smoking cigarettes. He has a 20.00 pack-year smoking history. He has never used smokeless tobacco. He reports current alcohol use. He reports that he does not use drugs.  Medications: Medications Prior to Admission  Medication Sig Dispense Refill   ascorbic acid (VITAMIN C) 500 MG tablet Take 500 mg by mouth daily.     cholecalciferol (VITAMIN D3) 25 MCG (1000 UNIT) tablet Take 1,000 Units by mouth daily.     hydrochlorothiazide (HYDRODIURIL) 25 MG tablet Take 25 mg by mouth daily.     Omega-3 Fatty Acids (FISH OIL) 1000 MG CAPS Take by mouth.      No results found for this or any previous visit (from the past 48 hour(s)).  No results found.    Blood pressure (!) 157/102, pulse 61, temperature (!) 97.5 F (36.4 C), temperature source Oral, resp. rate 14, height 5\' 11"  (1.803 m), weight 99.1 kg, SpO2 100 %.  General appearance: alert, cooperative, and appears stated age Head: Normocephalic, without obvious abnormality, atraumatic Neck: supple, symmetrical, trachea midline Extremities: Intact sensation and capillary refill all digits.  +epl/fpl/io.  No wounds.  Pulses: 2+ and  symmetric Skin: Skin color, texture, turgor normal. No rashes or lesions Neurologic: Grossly normal Incision/Wound: none  Assessment/Plan Left thumb and right long finger retained foreign bodies.  Plan removal foreign bodies and possibly hypertrophic skin left thumb.  Non operative and operative treatment options have been discussed with the patient and patient wishes to proceed with operative treatment. Risks, benefits, and alternatives of surgery have been discussed and the patient agrees with the plan of care.   02/06/2022, 9:08 AM

## 2022-02-06 NOTE — Transfer of Care (Signed)
Immediate Anesthesia Transfer of Care Note  Patient: Franklin Pittman  Procedure(s) Performed: LEFT THUMB REMOVAL FOREIGN BODY  AND RIGHT LONG FINGER REMOVAL FOREIGN BODY (Bilateral: Hand)  Patient Location: PACU  Anesthesia Type:General  Level of Consciousness: sedated  Airway & Oxygen Therapy: Patient Spontanous Breathing and Patient connected to face mask oxygen  Post-op Assessment: Report given to RN and Post -op Vital signs reviewed and stable  Post vital signs: Reviewed and stable  Last Vitals:  Vitals Value Taken Time  BP 128/85 02/06/22 1157  Temp    Pulse 81 02/06/22 1158  Resp 22 02/06/22 1158  SpO2 100 % 02/06/22 1158  Vitals shown include unvalidated device data.  Last Pain:  Vitals:   02/06/22 0801  TempSrc: Oral  PainSc: 1       Patients Stated Pain Goal: 1 (02/06/22 0801)  Complications: No notable events documented.

## 2022-02-07 LAB — SURGICAL PATHOLOGY

## 2022-02-10 ENCOUNTER — Encounter (HOSPITAL_BASED_OUTPATIENT_CLINIC_OR_DEPARTMENT_OTHER): Payer: Self-pay | Admitting: Orthopedic Surgery

## 2022-03-18 DIAGNOSIS — Z0000 Encounter for general adult medical examination without abnormal findings: Secondary | ICD-10-CM | POA: Diagnosis not present

## 2022-03-18 DIAGNOSIS — Z125 Encounter for screening for malignant neoplasm of prostate: Secondary | ICD-10-CM | POA: Diagnosis not present

## 2022-03-18 DIAGNOSIS — N492 Inflammatory disorders of scrotum: Secondary | ICD-10-CM | POA: Diagnosis not present

## 2022-03-18 DIAGNOSIS — N529 Male erectile dysfunction, unspecified: Secondary | ICD-10-CM | POA: Diagnosis not present

## 2022-03-18 DIAGNOSIS — I700 Atherosclerosis of aorta: Secondary | ICD-10-CM | POA: Diagnosis not present

## 2022-03-18 DIAGNOSIS — Z23 Encounter for immunization: Secondary | ICD-10-CM | POA: Diagnosis not present

## 2022-03-18 DIAGNOSIS — E7800 Pure hypercholesterolemia, unspecified: Secondary | ICD-10-CM | POA: Diagnosis not present

## 2022-03-18 DIAGNOSIS — Z1211 Encounter for screening for malignant neoplasm of colon: Secondary | ICD-10-CM | POA: Diagnosis not present

## 2022-03-18 DIAGNOSIS — I10 Essential (primary) hypertension: Secondary | ICD-10-CM | POA: Diagnosis not present

## 2022-03-25 DIAGNOSIS — Z1211 Encounter for screening for malignant neoplasm of colon: Secondary | ICD-10-CM | POA: Diagnosis not present

## 2022-04-17 DIAGNOSIS — M792 Neuralgia and neuritis, unspecified: Secondary | ICD-10-CM | POA: Diagnosis not present

## 2022-04-21 DIAGNOSIS — F1020 Alcohol dependence, uncomplicated: Secondary | ICD-10-CM | POA: Diagnosis not present

## 2022-04-21 DIAGNOSIS — Z6830 Body mass index (BMI) 30.0-30.9, adult: Secondary | ICD-10-CM | POA: Diagnosis not present

## 2022-04-21 DIAGNOSIS — R030 Elevated blood-pressure reading, without diagnosis of hypertension: Secondary | ICD-10-CM | POA: Diagnosis not present

## 2022-04-21 DIAGNOSIS — I10 Essential (primary) hypertension: Secondary | ICD-10-CM | POA: Diagnosis not present

## 2022-04-21 DIAGNOSIS — F17200 Nicotine dependence, unspecified, uncomplicated: Secondary | ICD-10-CM | POA: Diagnosis not present

## 2022-04-21 DIAGNOSIS — M25519 Pain in unspecified shoulder: Secondary | ICD-10-CM | POA: Diagnosis not present

## 2022-04-28 ENCOUNTER — Other Ambulatory Visit: Payer: Self-pay | Admitting: Family Medicine

## 2022-04-28 ENCOUNTER — Ambulatory Visit
Admission: RE | Admit: 2022-04-28 | Discharge: 2022-04-28 | Disposition: A | Discharge status: Home / Self Care | Payer: No Typology Code available for payment source | Source: Ambulatory Visit | Attending: Family Medicine | Admitting: Family Medicine

## 2022-04-28 DIAGNOSIS — I6522 Occlusion and stenosis of left carotid artery: Secondary | ICD-10-CM | POA: Diagnosis not present

## 2022-04-28 DIAGNOSIS — M5410 Radiculopathy, site unspecified: Secondary | ICD-10-CM

## 2022-04-28 DIAGNOSIS — M47812 Spondylosis without myelopathy or radiculopathy, cervical region: Secondary | ICD-10-CM | POA: Diagnosis not present

## 2022-05-07 ENCOUNTER — Ambulatory Visit: Payer: No Typology Code available for payment source | Admitting: Physical Therapy

## 2022-05-07 DIAGNOSIS — Z1329 Encounter for screening for other suspected endocrine disorder: Secondary | ICD-10-CM | POA: Diagnosis not present

## 2022-05-07 DIAGNOSIS — Z125 Encounter for screening for malignant neoplasm of prostate: Secondary | ICD-10-CM | POA: Diagnosis not present

## 2022-05-07 DIAGNOSIS — Z0001 Encounter for general adult medical examination with abnormal findings: Secondary | ICD-10-CM | POA: Diagnosis not present

## 2022-05-07 DIAGNOSIS — E559 Vitamin D deficiency, unspecified: Secondary | ICD-10-CM | POA: Diagnosis not present

## 2022-05-07 DIAGNOSIS — Z131 Encounter for screening for diabetes mellitus: Secondary | ICD-10-CM | POA: Diagnosis not present

## 2022-05-07 DIAGNOSIS — F17200 Nicotine dependence, unspecified, uncomplicated: Secondary | ICD-10-CM | POA: Diagnosis not present

## 2022-05-07 DIAGNOSIS — F1020 Alcohol dependence, uncomplicated: Secondary | ICD-10-CM | POA: Diagnosis not present

## 2022-05-07 DIAGNOSIS — I10 Essential (primary) hypertension: Secondary | ICD-10-CM | POA: Diagnosis not present

## 2022-05-07 DIAGNOSIS — Z13220 Encounter for screening for lipoid disorders: Secondary | ICD-10-CM | POA: Diagnosis not present

## 2022-05-12 DIAGNOSIS — R531 Weakness: Secondary | ICD-10-CM | POA: Diagnosis not present

## 2022-05-12 DIAGNOSIS — M25611 Stiffness of right shoulder, not elsewhere classified: Secondary | ICD-10-CM | POA: Diagnosis not present

## 2022-05-12 DIAGNOSIS — M25512 Pain in left shoulder: Secondary | ICD-10-CM | POA: Diagnosis not present

## 2022-05-12 DIAGNOSIS — M542 Cervicalgia: Secondary | ICD-10-CM | POA: Diagnosis not present

## 2022-05-12 DIAGNOSIS — M25511 Pain in right shoulder: Secondary | ICD-10-CM | POA: Diagnosis not present

## 2022-05-21 DIAGNOSIS — M542 Cervicalgia: Secondary | ICD-10-CM | POA: Diagnosis not present

## 2022-05-21 DIAGNOSIS — M25611 Stiffness of right shoulder, not elsewhere classified: Secondary | ICD-10-CM | POA: Diagnosis not present

## 2022-05-21 DIAGNOSIS — R531 Weakness: Secondary | ICD-10-CM | POA: Diagnosis not present

## 2022-05-21 DIAGNOSIS — M25512 Pain in left shoulder: Secondary | ICD-10-CM | POA: Diagnosis not present

## 2022-05-21 DIAGNOSIS — M25511 Pain in right shoulder: Secondary | ICD-10-CM | POA: Diagnosis not present

## 2022-05-23 DIAGNOSIS — M25512 Pain in left shoulder: Secondary | ICD-10-CM | POA: Diagnosis not present

## 2022-05-23 DIAGNOSIS — R531 Weakness: Secondary | ICD-10-CM | POA: Diagnosis not present

## 2022-05-23 DIAGNOSIS — M542 Cervicalgia: Secondary | ICD-10-CM | POA: Diagnosis not present

## 2022-05-23 DIAGNOSIS — M25511 Pain in right shoulder: Secondary | ICD-10-CM | POA: Diagnosis not present

## 2022-05-23 DIAGNOSIS — M25611 Stiffness of right shoulder, not elsewhere classified: Secondary | ICD-10-CM | POA: Diagnosis not present

## 2022-05-26 DIAGNOSIS — M25511 Pain in right shoulder: Secondary | ICD-10-CM | POA: Diagnosis not present

## 2022-05-26 DIAGNOSIS — M25512 Pain in left shoulder: Secondary | ICD-10-CM | POA: Diagnosis not present

## 2022-05-26 DIAGNOSIS — M542 Cervicalgia: Secondary | ICD-10-CM | POA: Diagnosis not present

## 2022-05-26 DIAGNOSIS — M25611 Stiffness of right shoulder, not elsewhere classified: Secondary | ICD-10-CM | POA: Diagnosis not present

## 2022-05-26 DIAGNOSIS — R531 Weakness: Secondary | ICD-10-CM | POA: Diagnosis not present

## 2022-05-27 DIAGNOSIS — Z6830 Body mass index (BMI) 30.0-30.9, adult: Secondary | ICD-10-CM | POA: Diagnosis not present

## 2022-05-27 DIAGNOSIS — I10 Essential (primary) hypertension: Secondary | ICD-10-CM | POA: Diagnosis not present

## 2022-05-27 DIAGNOSIS — E7801 Familial hypercholesterolemia: Secondary | ICD-10-CM | POA: Diagnosis not present

## 2022-05-27 DIAGNOSIS — F1020 Alcohol dependence, uncomplicated: Secondary | ICD-10-CM | POA: Diagnosis not present

## 2022-05-27 DIAGNOSIS — F17200 Nicotine dependence, unspecified, uncomplicated: Secondary | ICD-10-CM | POA: Diagnosis not present

## 2022-05-27 DIAGNOSIS — M25519 Pain in unspecified shoulder: Secondary | ICD-10-CM | POA: Diagnosis not present

## 2022-05-27 DIAGNOSIS — E1169 Type 2 diabetes mellitus with other specified complication: Secondary | ICD-10-CM | POA: Diagnosis not present

## 2022-05-27 DIAGNOSIS — E559 Vitamin D deficiency, unspecified: Secondary | ICD-10-CM | POA: Diagnosis not present

## 2022-05-28 DIAGNOSIS — M542 Cervicalgia: Secondary | ICD-10-CM | POA: Diagnosis not present

## 2022-05-28 DIAGNOSIS — R531 Weakness: Secondary | ICD-10-CM | POA: Diagnosis not present

## 2022-05-28 DIAGNOSIS — M25511 Pain in right shoulder: Secondary | ICD-10-CM | POA: Diagnosis not present

## 2022-05-28 DIAGNOSIS — M25512 Pain in left shoulder: Secondary | ICD-10-CM | POA: Diagnosis not present

## 2022-05-28 DIAGNOSIS — M25611 Stiffness of right shoulder, not elsewhere classified: Secondary | ICD-10-CM | POA: Diagnosis not present

## 2022-06-05 DIAGNOSIS — M25611 Stiffness of right shoulder, not elsewhere classified: Secondary | ICD-10-CM | POA: Diagnosis not present

## 2022-06-05 DIAGNOSIS — M25511 Pain in right shoulder: Secondary | ICD-10-CM | POA: Diagnosis not present

## 2022-06-05 DIAGNOSIS — R531 Weakness: Secondary | ICD-10-CM | POA: Diagnosis not present

## 2022-06-05 DIAGNOSIS — M542 Cervicalgia: Secondary | ICD-10-CM | POA: Diagnosis not present

## 2022-06-05 DIAGNOSIS — M25512 Pain in left shoulder: Secondary | ICD-10-CM | POA: Diagnosis not present

## 2022-06-09 DIAGNOSIS — M25512 Pain in left shoulder: Secondary | ICD-10-CM | POA: Diagnosis not present

## 2022-06-09 DIAGNOSIS — R531 Weakness: Secondary | ICD-10-CM | POA: Diagnosis not present

## 2022-06-09 DIAGNOSIS — M25511 Pain in right shoulder: Secondary | ICD-10-CM | POA: Diagnosis not present

## 2022-06-09 DIAGNOSIS — M25611 Stiffness of right shoulder, not elsewhere classified: Secondary | ICD-10-CM | POA: Diagnosis not present

## 2022-06-09 DIAGNOSIS — M542 Cervicalgia: Secondary | ICD-10-CM | POA: Diagnosis not present

## 2022-06-12 DIAGNOSIS — M5412 Radiculopathy, cervical region: Secondary | ICD-10-CM | POA: Diagnosis not present

## 2022-06-12 DIAGNOSIS — Z6829 Body mass index (BMI) 29.0-29.9, adult: Secondary | ICD-10-CM | POA: Diagnosis not present

## 2022-06-12 DIAGNOSIS — F17210 Nicotine dependence, cigarettes, uncomplicated: Secondary | ICD-10-CM | POA: Diagnosis not present

## 2022-06-12 DIAGNOSIS — R200 Anesthesia of skin: Secondary | ICD-10-CM | POA: Diagnosis not present

## 2022-06-12 DIAGNOSIS — R030 Elevated blood-pressure reading, without diagnosis of hypertension: Secondary | ICD-10-CM | POA: Diagnosis not present

## 2022-06-13 ENCOUNTER — Other Ambulatory Visit (HOSPITAL_COMMUNITY): Payer: Self-pay | Admitting: Family Medicine

## 2022-06-13 DIAGNOSIS — M5412 Radiculopathy, cervical region: Secondary | ICD-10-CM

## 2022-06-23 ENCOUNTER — Ambulatory Visit (HOSPITAL_COMMUNITY)
Admission: RE | Admit: 2022-06-23 | Discharge: 2022-06-23 | Disposition: A | Discharge status: Home / Self Care | Payer: No Typology Code available for payment source | Source: Ambulatory Visit | Attending: Family Medicine | Admitting: Family Medicine

## 2022-06-23 DIAGNOSIS — M5412 Radiculopathy, cervical region: Secondary | ICD-10-CM

## 2022-06-29 IMAGING — CR DG CHEST 2V
2 series · 2 of 2 positions shown · non-contrast
Comparison: 11/03/2017 report.  01/29/2007 and prior.

CLINICAL DATA: Chest congestion

EXAM:
CHEST - 2 VIEW

[w chest pa]
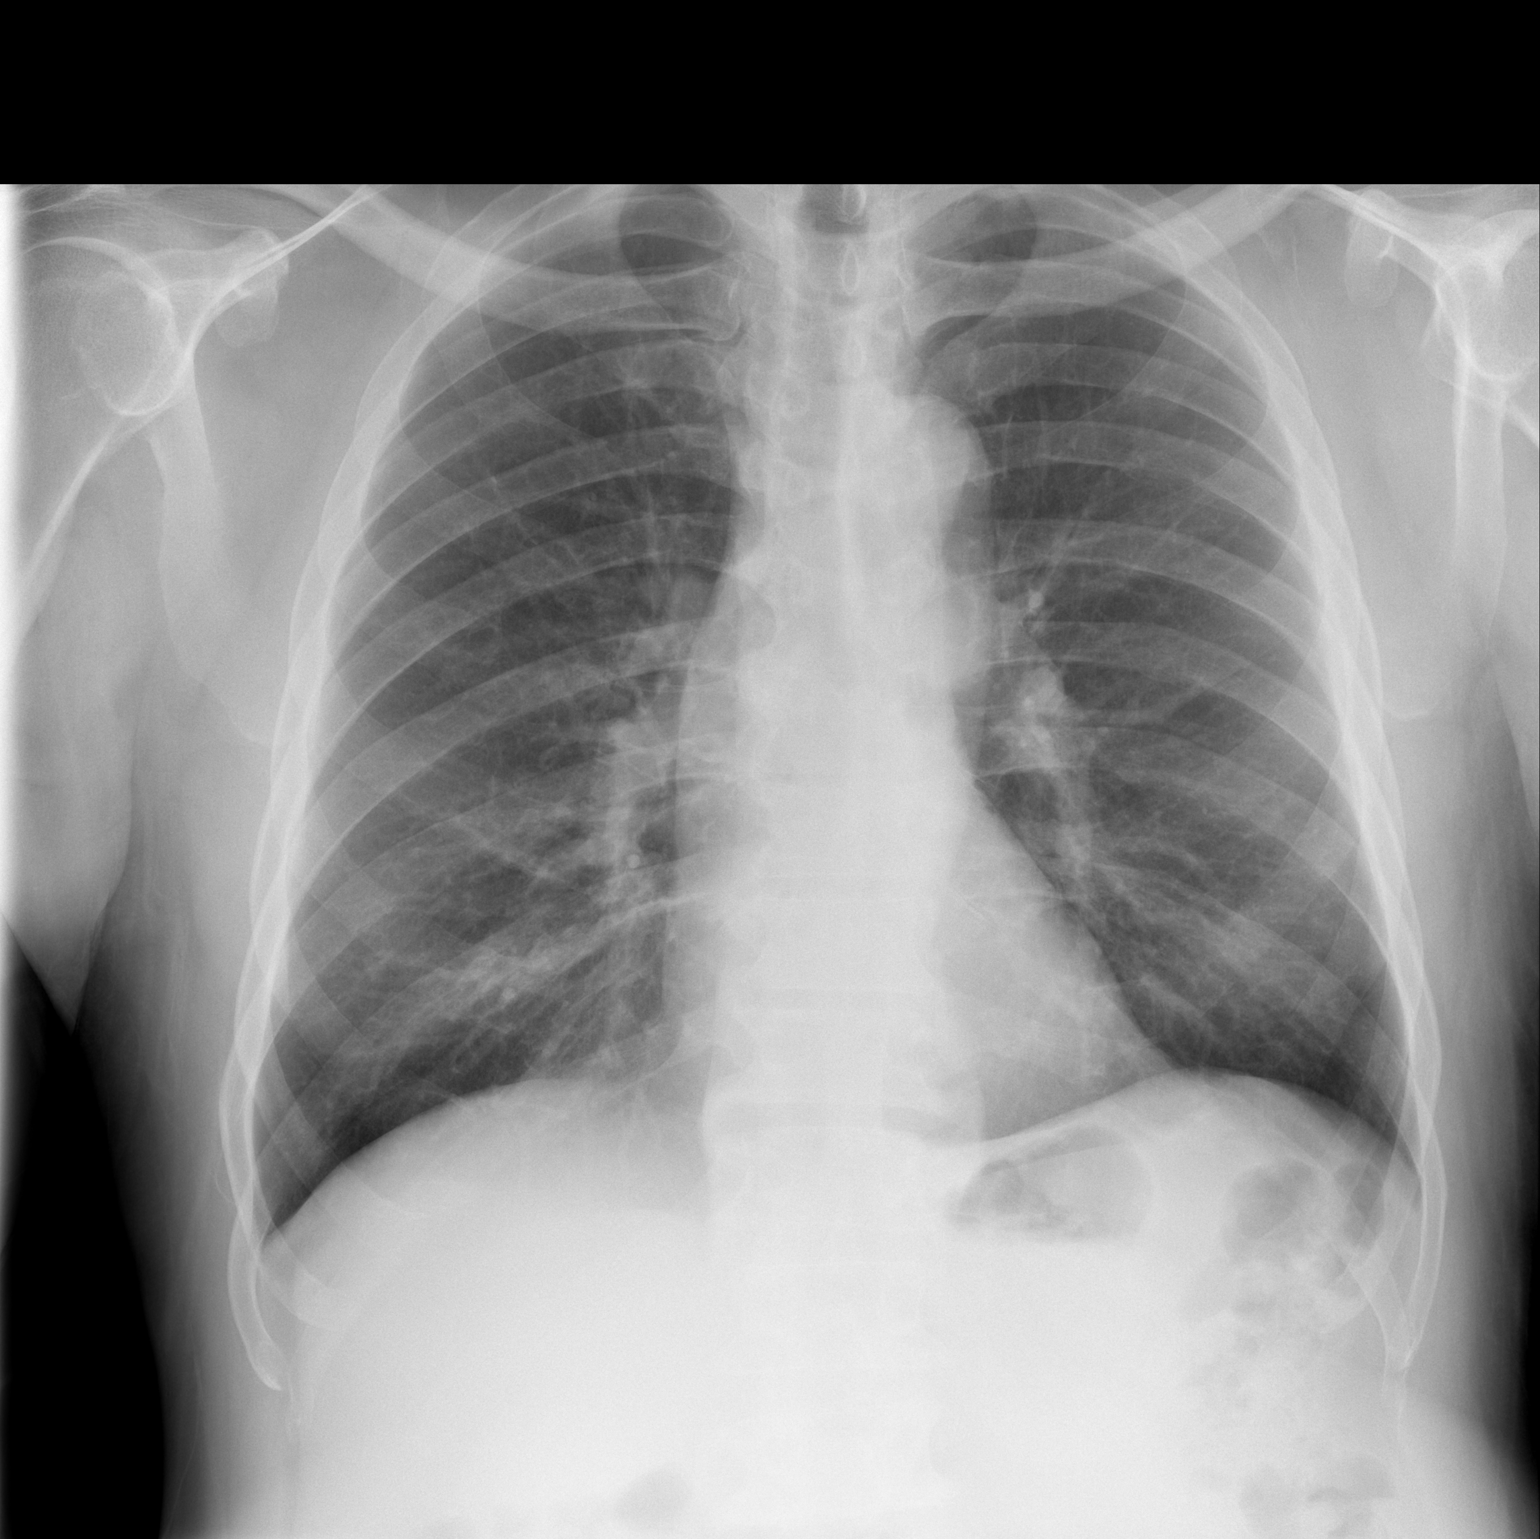

[w chest lat]
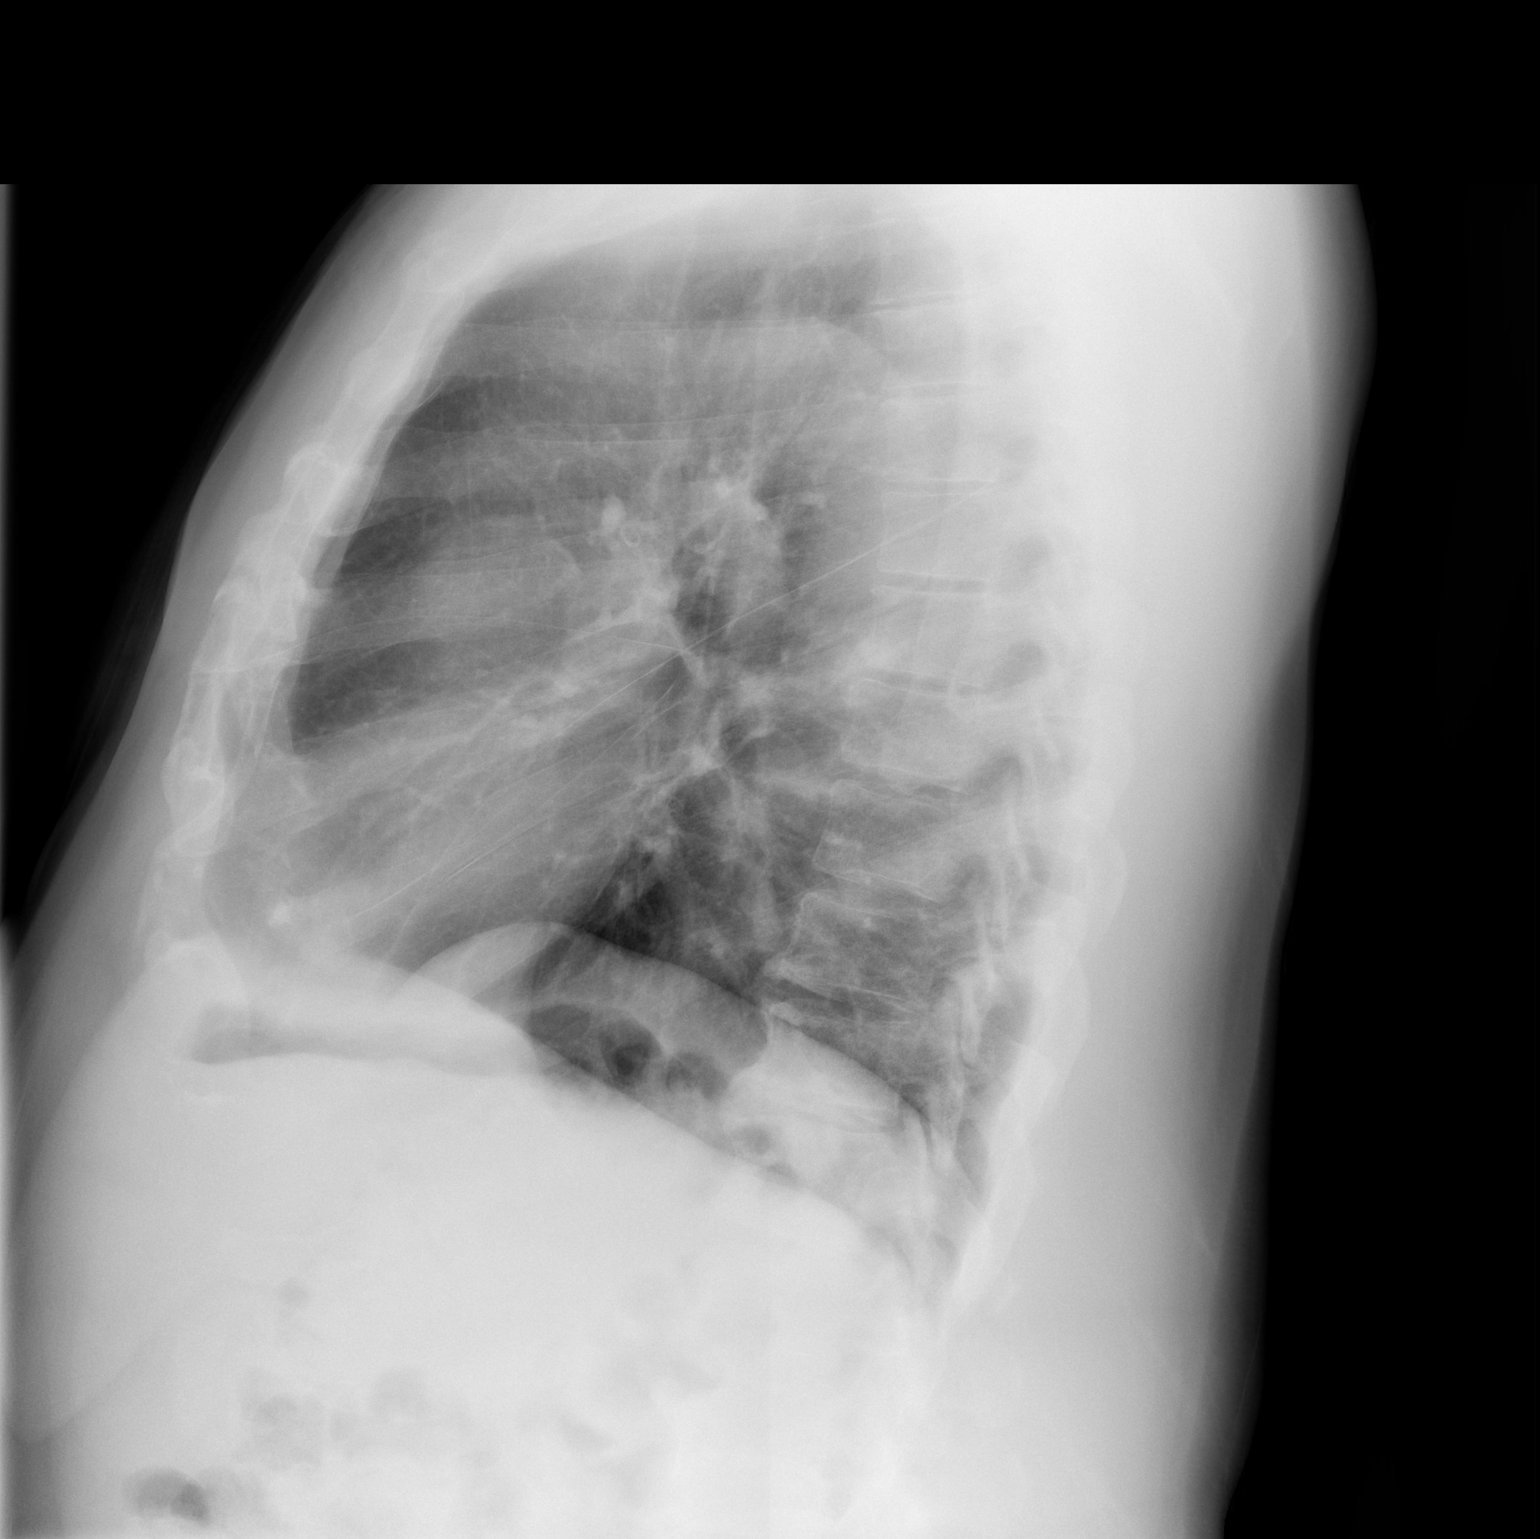

[2 of 2 positions shown; findings below may reference images not displayed]

FINDINGS: New patchy bibasilar opacities. No pneumothorax or pleural effusion.
Cardiomediastinal silhouette is within normal limits. No acute
osseous abnormality.
IMPRESSION: Patchy bibasilar opacities, atelectasis versus infiltrate.

## 2022-07-01 DIAGNOSIS — Z6829 Body mass index (BMI) 29.0-29.9, adult: Secondary | ICD-10-CM | POA: Diagnosis not present

## 2022-07-01 DIAGNOSIS — M4722 Other spondylosis with radiculopathy, cervical region: Secondary | ICD-10-CM | POA: Diagnosis not present

## 2022-07-03 DIAGNOSIS — E7801 Familial hypercholesterolemia: Secondary | ICD-10-CM | POA: Diagnosis not present

## 2022-07-03 DIAGNOSIS — M4802 Spinal stenosis, cervical region: Secondary | ICD-10-CM | POA: Diagnosis not present

## 2022-07-03 DIAGNOSIS — Z1331 Encounter for screening for depression: Secondary | ICD-10-CM | POA: Diagnosis not present

## 2022-07-03 DIAGNOSIS — E559 Vitamin D deficiency, unspecified: Secondary | ICD-10-CM | POA: Diagnosis not present

## 2022-07-03 DIAGNOSIS — M25519 Pain in unspecified shoulder: Secondary | ICD-10-CM | POA: Diagnosis not present

## 2022-07-03 DIAGNOSIS — E1169 Type 2 diabetes mellitus with other specified complication: Secondary | ICD-10-CM | POA: Diagnosis not present

## 2022-07-03 DIAGNOSIS — F1020 Alcohol dependence, uncomplicated: Secondary | ICD-10-CM | POA: Diagnosis not present

## 2022-07-03 DIAGNOSIS — Z1389 Encounter for screening for other disorder: Secondary | ICD-10-CM | POA: Diagnosis not present

## 2022-07-03 DIAGNOSIS — F17200 Nicotine dependence, unspecified, uncomplicated: Secondary | ICD-10-CM | POA: Diagnosis not present

## 2022-07-03 DIAGNOSIS — G629 Polyneuropathy, unspecified: Secondary | ICD-10-CM | POA: Diagnosis not present

## 2022-07-03 DIAGNOSIS — Z6829 Body mass index (BMI) 29.0-29.9, adult: Secondary | ICD-10-CM | POA: Diagnosis not present

## 2022-07-03 DIAGNOSIS — I10 Essential (primary) hypertension: Secondary | ICD-10-CM | POA: Diagnosis not present

## 2022-07-31 DIAGNOSIS — Z125 Encounter for screening for malignant neoplasm of prostate: Secondary | ICD-10-CM | POA: Diagnosis not present

## 2022-07-31 DIAGNOSIS — I10 Essential (primary) hypertension: Secondary | ICD-10-CM | POA: Diagnosis not present

## 2022-07-31 DIAGNOSIS — E1169 Type 2 diabetes mellitus with other specified complication: Secondary | ICD-10-CM | POA: Diagnosis not present

## 2022-07-31 DIAGNOSIS — E7801 Familial hypercholesterolemia: Secondary | ICD-10-CM | POA: Diagnosis not present

## 2022-08-04 ENCOUNTER — Other Ambulatory Visit (HOSPITAL_COMMUNITY): Payer: Self-pay | Admitting: Internal Medicine

## 2022-08-04 DIAGNOSIS — F1020 Alcohol dependence, uncomplicated: Secondary | ICD-10-CM | POA: Diagnosis not present

## 2022-08-04 DIAGNOSIS — E1169 Type 2 diabetes mellitus with other specified complication: Secondary | ICD-10-CM | POA: Diagnosis not present

## 2022-08-04 DIAGNOSIS — E559 Vitamin D deficiency, unspecified: Secondary | ICD-10-CM | POA: Diagnosis not present

## 2022-08-04 DIAGNOSIS — M25519 Pain in unspecified shoulder: Secondary | ICD-10-CM | POA: Diagnosis not present

## 2022-08-04 DIAGNOSIS — I10 Essential (primary) hypertension: Secondary | ICD-10-CM | POA: Diagnosis not present

## 2022-08-04 DIAGNOSIS — G629 Polyneuropathy, unspecified: Secondary | ICD-10-CM | POA: Diagnosis not present

## 2022-08-04 DIAGNOSIS — F17200 Nicotine dependence, unspecified, uncomplicated: Secondary | ICD-10-CM | POA: Diagnosis not present

## 2022-08-04 DIAGNOSIS — E7801 Familial hypercholesterolemia: Secondary | ICD-10-CM | POA: Diagnosis not present

## 2022-08-04 DIAGNOSIS — F17210 Nicotine dependence, cigarettes, uncomplicated: Secondary | ICD-10-CM

## 2022-08-04 DIAGNOSIS — Z6828 Body mass index (BMI) 28.0-28.9, adult: Secondary | ICD-10-CM | POA: Diagnosis not present

## 2022-08-04 DIAGNOSIS — M4802 Spinal stenosis, cervical region: Secondary | ICD-10-CM | POA: Diagnosis not present

## 2022-08-07 ENCOUNTER — Ambulatory Visit (HOSPITAL_COMMUNITY)
Admission: RE | Admit: 2022-08-07 | Discharge: 2022-08-07 | Disposition: A | Discharge status: Home / Self Care | Payer: No Typology Code available for payment source | Source: Ambulatory Visit | Attending: Internal Medicine | Admitting: Internal Medicine

## 2022-08-07 DIAGNOSIS — F17210 Nicotine dependence, cigarettes, uncomplicated: Secondary | ICD-10-CM | POA: Insufficient documentation

## 2022-08-25 DIAGNOSIS — G5603 Carpal tunnel syndrome, bilateral upper limbs: Secondary | ICD-10-CM | POA: Diagnosis not present

## 2022-09-09 IMAGING — CT CT CHEST W/ CM
2 of 4 series · 15 of 36 positions shown, 18 images · IV contrast (iopamidol)
Comparison: Chest x-ray 03/13/2020

CLINICAL DATA: Bibasilar opacities. Congestion since February 2020.
No improvement after antibiotics.

EXAM:
CT CHEST WITH CONTRAST
TECHNIQUE: Multidetector CT imaging of the chest was performed during
intravenous contrast administration.
CONTRAST:  75mL DTCL4E-F33 IOPAMIDOL (DTCL4E-F33) INJECTION 61%

[Series 2: chest 2.00 br40 s3 · axial · 0.81mm/px · z∈[+1535,+1831]mm · 12 of 172 slices shown, 15 images (1 of 2)]
[im 12/172  mediastinal]
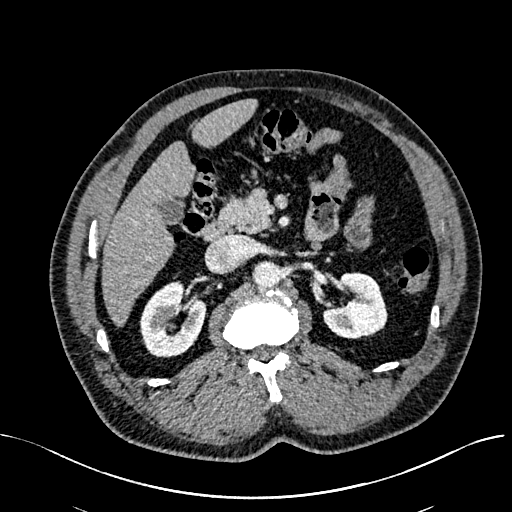
[im 12/172  lung]
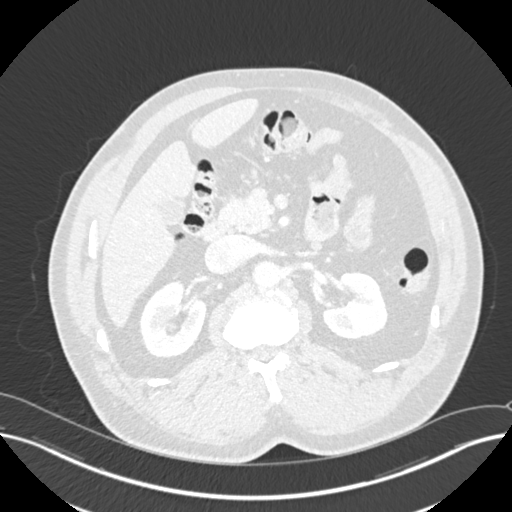
[im 23/172  lung]
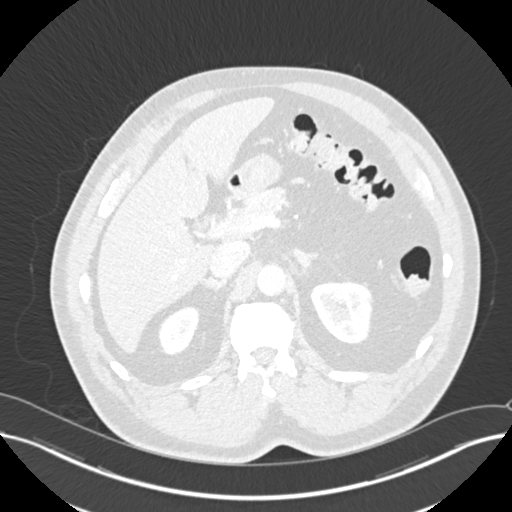
[im 35/172  lung]
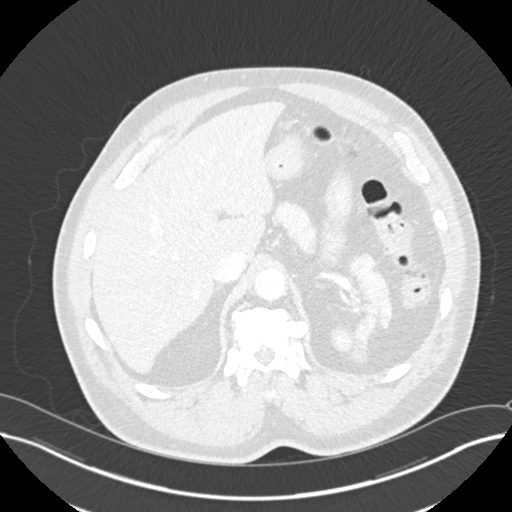
[im 58/172  lung]
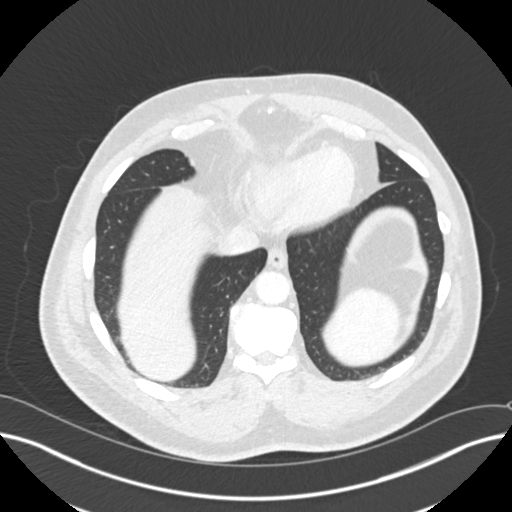
[im 69/172  mediastinal]
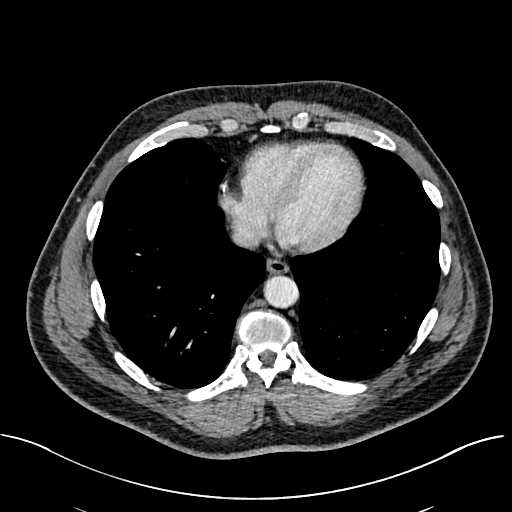
[im 69/172  lung]
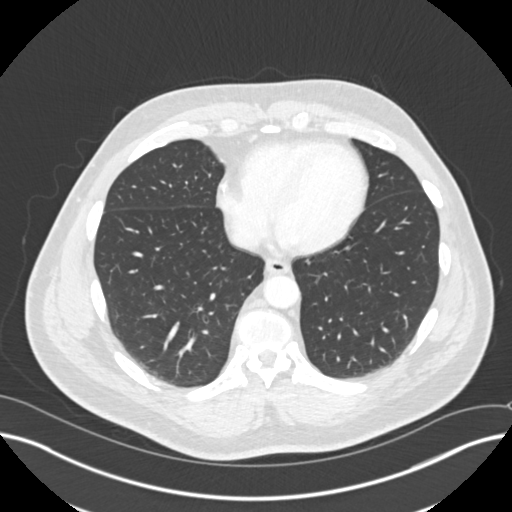
[im 80/172  lung]
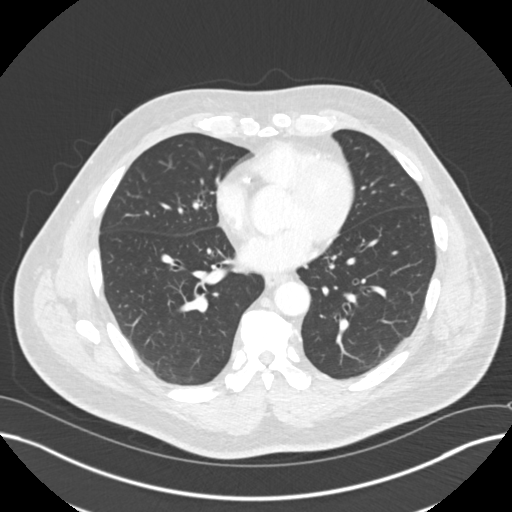
[im 92/172  lung]
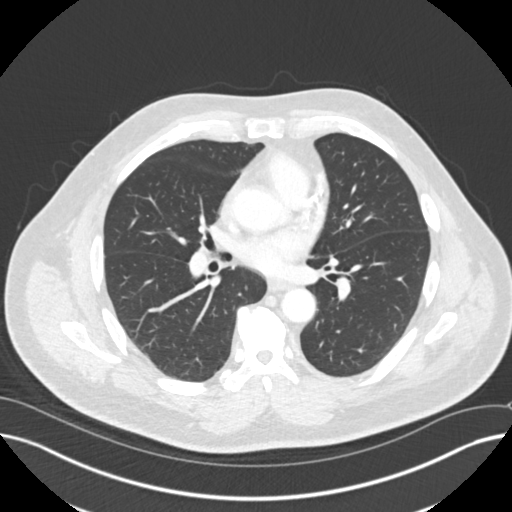
[im 103/172  lung]
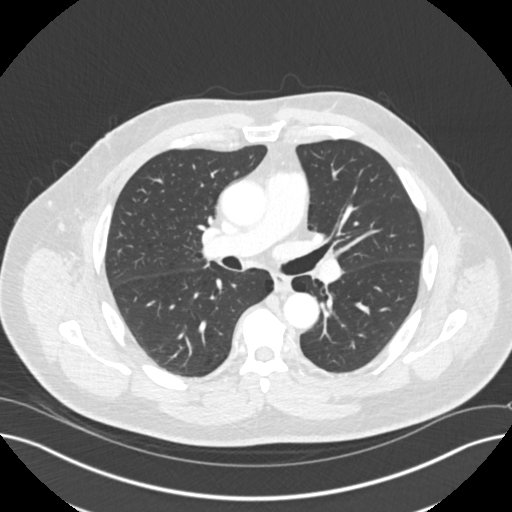
[im 115/172  mediastinal]
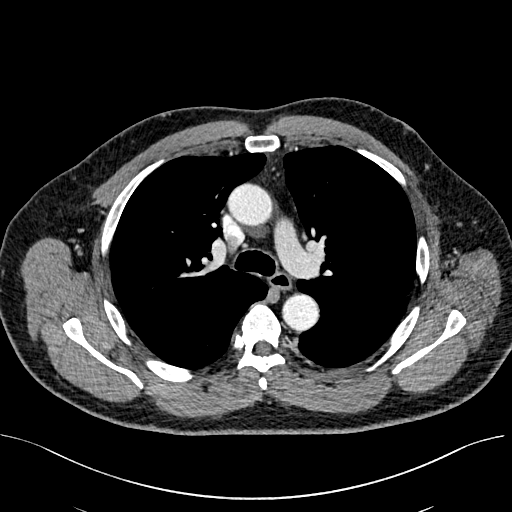
[im 115/172  lung]
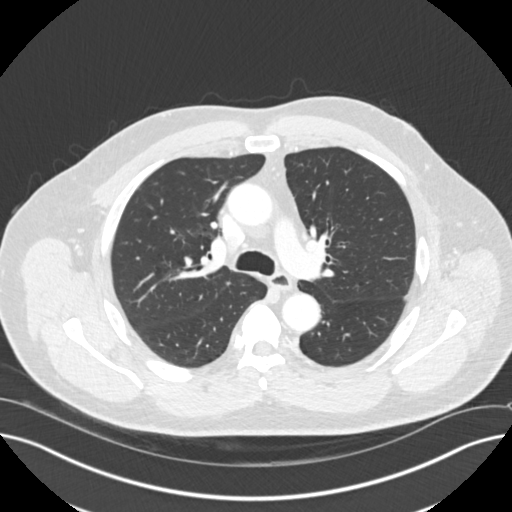
[im 137/172  lung]
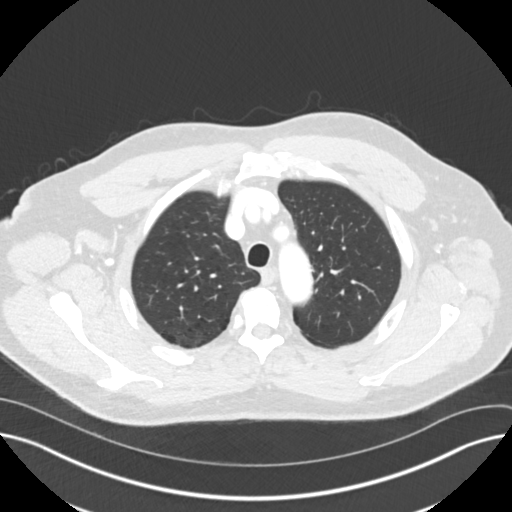
[im 149/172  lung]
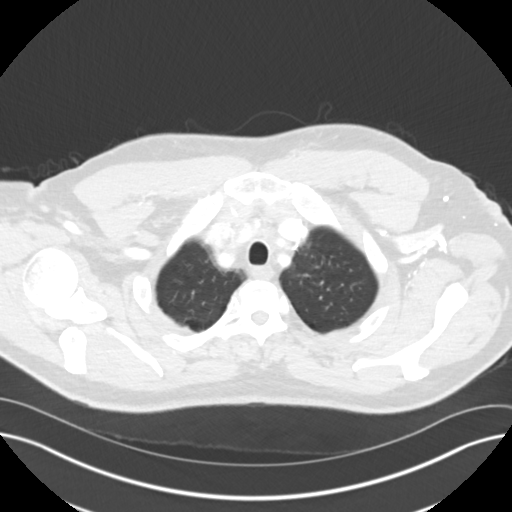
[im 160/172  lung]
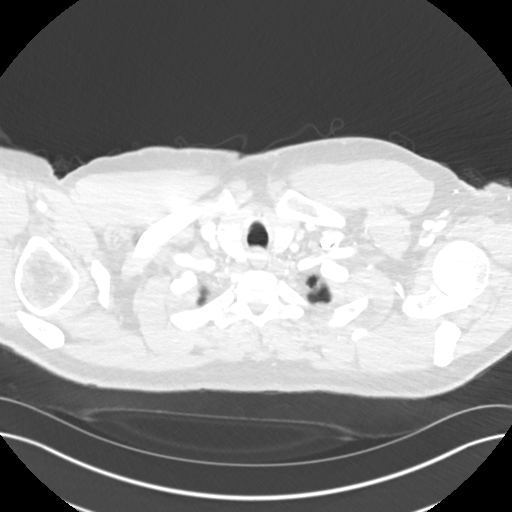

[Series 4: chest 2.00 br40 s3 · coronal · 0.67mm/px · 3 of 207 slices shown (2 of 2)]
[im 42/207  lung]
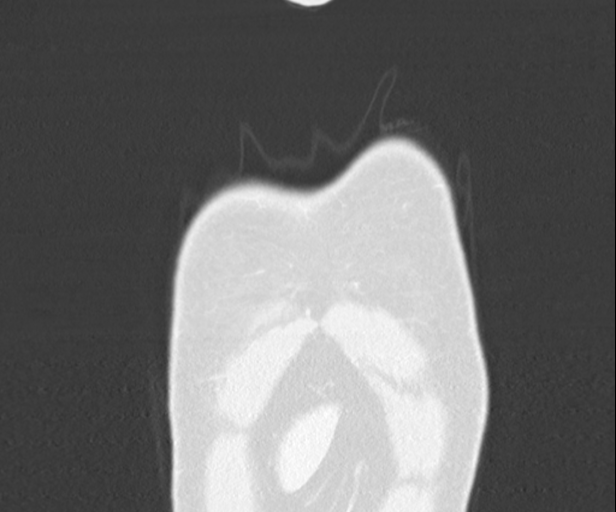
[im 83/207  lung]
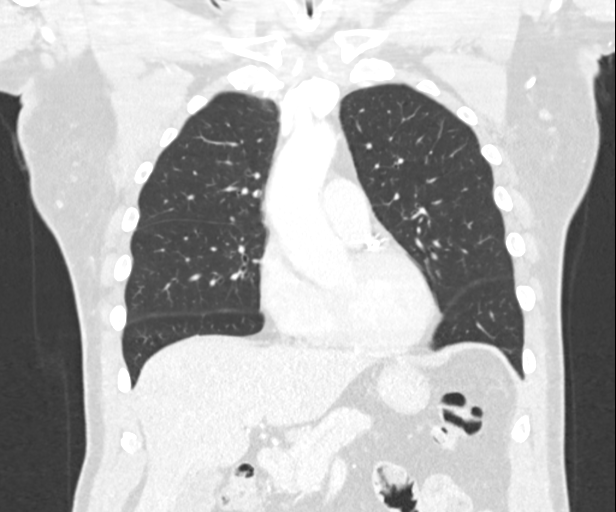
[im 124/207  lung]
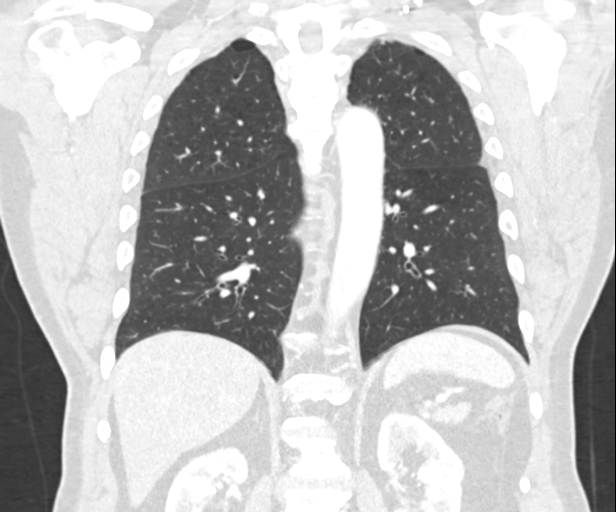

[15 of 36 positions shown; findings below may reference images not displayed]

FINDINGS: Cardiovascular: Extensive coronary artery calcifications. Scattered
aortic calcifications. Heart is normal size. Aorta is normal
caliber.

Mediastinum/Nodes: No mediastinal, hilar, or axillary adenopathy.
Trachea and esophagus are unremarkable. Right thyroid nodule
measures 2.5 cm.

Lungs/Pleura: Mild centrilobular and paraseptal emphysema. Lungs are
clear. No focal airspace opacities or suspicious nodules. No
effusions.

Upper Abdomen: Imaging into the upper abdomen demonstrates no acute
findings.

Musculoskeletal: Chest wall soft tissues are unremarkable. No acute
bony abnormality.
IMPRESSION: No confluent airspace opacities within the lungs.

Extensive coronary artery disease.

Aortic Atherosclerosis (8X15S-6AJ.J) and Emphysema (8X15S-S8V.M).

## 2022-09-26 DIAGNOSIS — Z0001 Encounter for general adult medical examination with abnormal findings: Secondary | ICD-10-CM | POA: Diagnosis not present

## 2022-09-26 DIAGNOSIS — I10 Essential (primary) hypertension: Secondary | ICD-10-CM | POA: Diagnosis not present

## 2022-09-26 DIAGNOSIS — F17200 Nicotine dependence, unspecified, uncomplicated: Secondary | ICD-10-CM | POA: Diagnosis not present

## 2022-09-26 DIAGNOSIS — E1169 Type 2 diabetes mellitus with other specified complication: Secondary | ICD-10-CM | POA: Diagnosis not present

## 2022-09-26 DIAGNOSIS — E7801 Familial hypercholesterolemia: Secondary | ICD-10-CM | POA: Diagnosis not present

## 2022-10-01 DIAGNOSIS — G5601 Carpal tunnel syndrome, right upper limb: Secondary | ICD-10-CM | POA: Diagnosis not present

## 2022-11-13 DIAGNOSIS — Z6828 Body mass index (BMI) 28.0-28.9, adult: Secondary | ICD-10-CM | POA: Diagnosis not present

## 2022-11-13 DIAGNOSIS — F17200 Nicotine dependence, unspecified, uncomplicated: Secondary | ICD-10-CM | POA: Diagnosis not present

## 2022-11-13 DIAGNOSIS — G629 Polyneuropathy, unspecified: Secondary | ICD-10-CM | POA: Diagnosis not present

## 2022-11-13 DIAGNOSIS — E1169 Type 2 diabetes mellitus with other specified complication: Secondary | ICD-10-CM | POA: Diagnosis not present

## 2022-11-13 DIAGNOSIS — E7801 Familial hypercholesterolemia: Secondary | ICD-10-CM | POA: Diagnosis not present

## 2022-11-13 DIAGNOSIS — E559 Vitamin D deficiency, unspecified: Secondary | ICD-10-CM | POA: Diagnosis not present

## 2022-11-13 DIAGNOSIS — M25519 Pain in unspecified shoulder: Secondary | ICD-10-CM | POA: Diagnosis not present

## 2022-11-13 DIAGNOSIS — M4802 Spinal stenosis, cervical region: Secondary | ICD-10-CM | POA: Diagnosis not present

## 2022-11-13 DIAGNOSIS — L84 Corns and callosities: Secondary | ICD-10-CM | POA: Diagnosis not present

## 2022-11-13 DIAGNOSIS — I10 Essential (primary) hypertension: Secondary | ICD-10-CM | POA: Diagnosis not present

## 2022-11-13 DIAGNOSIS — F1020 Alcohol dependence, uncomplicated: Secondary | ICD-10-CM | POA: Diagnosis not present

## 2023-02-03 DIAGNOSIS — J9811 Atelectasis: Secondary | ICD-10-CM | POA: Diagnosis not present

## 2023-02-03 DIAGNOSIS — S3991XA Unspecified injury of abdomen, initial encounter: Secondary | ICD-10-CM | POA: Diagnosis not present

## 2023-02-03 DIAGNOSIS — W130XXA Fall from, out of or through balcony, initial encounter: Secondary | ICD-10-CM | POA: Diagnosis not present

## 2023-02-03 DIAGNOSIS — S2241XA Multiple fractures of ribs, right side, initial encounter for closed fracture: Secondary | ICD-10-CM | POA: Diagnosis not present

## 2023-02-03 DIAGNOSIS — E041 Nontoxic single thyroid nodule: Secondary | ICD-10-CM | POA: Diagnosis not present

## 2023-02-03 DIAGNOSIS — S27321A Contusion of lung, unilateral, initial encounter: Secondary | ICD-10-CM | POA: Diagnosis not present

## 2023-02-03 DIAGNOSIS — R918 Other nonspecific abnormal finding of lung field: Secondary | ICD-10-CM | POA: Diagnosis not present

## 2023-02-03 DIAGNOSIS — R079 Chest pain, unspecified: Secondary | ICD-10-CM | POA: Diagnosis not present

## 2023-02-03 DIAGNOSIS — E1140 Type 2 diabetes mellitus with diabetic neuropathy, unspecified: Secondary | ICD-10-CM | POA: Diagnosis not present

## 2023-02-03 DIAGNOSIS — Z5329 Procedure and treatment not carried out because of patient's decision for other reasons: Secondary | ICD-10-CM | POA: Diagnosis not present

## 2023-02-03 DIAGNOSIS — I10 Essential (primary) hypertension: Secondary | ICD-10-CM | POA: Diagnosis not present

## 2023-02-03 DIAGNOSIS — I700 Atherosclerosis of aorta: Secondary | ICD-10-CM | POA: Diagnosis not present

## 2023-02-03 DIAGNOSIS — R9431 Abnormal electrocardiogram [ECG] [EKG]: Secondary | ICD-10-CM | POA: Diagnosis not present

## 2023-02-03 DIAGNOSIS — E785 Hyperlipidemia, unspecified: Secondary | ICD-10-CM | POA: Diagnosis not present

## 2023-02-03 DIAGNOSIS — R1011 Right upper quadrant pain: Secondary | ICD-10-CM | POA: Diagnosis not present

## 2023-02-03 DIAGNOSIS — F17210 Nicotine dependence, cigarettes, uncomplicated: Secondary | ICD-10-CM | POA: Diagnosis not present

## 2023-02-06 DIAGNOSIS — S27329A Contusion of lung, unspecified, initial encounter: Secondary | ICD-10-CM | POA: Diagnosis not present

## 2023-02-06 DIAGNOSIS — E041 Nontoxic single thyroid nodule: Secondary | ICD-10-CM | POA: Diagnosis not present

## 2023-02-06 DIAGNOSIS — S2231XA Fracture of one rib, right side, initial encounter for closed fracture: Secondary | ICD-10-CM | POA: Diagnosis not present

## 2023-02-06 DIAGNOSIS — F17210 Nicotine dependence, cigarettes, uncomplicated: Secondary | ICD-10-CM | POA: Diagnosis not present

## 2023-02-06 DIAGNOSIS — Z6829 Body mass index (BMI) 29.0-29.9, adult: Secondary | ICD-10-CM | POA: Diagnosis not present

## 2023-02-06 DIAGNOSIS — R030 Elevated blood-pressure reading, without diagnosis of hypertension: Secondary | ICD-10-CM | POA: Diagnosis not present

## 2023-02-06 DIAGNOSIS — K4090 Unilateral inguinal hernia, without obstruction or gangrene, not specified as recurrent: Secondary | ICD-10-CM | POA: Diagnosis not present

## 2023-02-09 ENCOUNTER — Other Ambulatory Visit (HOSPITAL_COMMUNITY): Payer: Self-pay | Admitting: Family Medicine

## 2023-02-09 DIAGNOSIS — E041 Nontoxic single thyroid nodule: Secondary | ICD-10-CM

## 2023-02-18 ENCOUNTER — Ambulatory Visit (HOSPITAL_COMMUNITY)
Admission: RE | Admit: 2023-02-18 | Discharge: 2023-02-18 | Disposition: A | Discharge status: Home / Self Care | Payer: No Typology Code available for payment source | Source: Ambulatory Visit | Attending: Family Medicine | Admitting: Family Medicine

## 2023-02-18 DIAGNOSIS — E041 Nontoxic single thyroid nodule: Secondary | ICD-10-CM | POA: Diagnosis not present

## 2023-02-18 DIAGNOSIS — E042 Nontoxic multinodular goiter: Secondary | ICD-10-CM | POA: Diagnosis not present

## 2023-03-04 DIAGNOSIS — H5203 Hypermetropia, bilateral: Secondary | ICD-10-CM | POA: Diagnosis not present

## 2023-03-04 DIAGNOSIS — H524 Presbyopia: Secondary | ICD-10-CM | POA: Diagnosis not present

## 2023-03-11 DIAGNOSIS — S39012A Strain of muscle, fascia and tendon of lower back, initial encounter: Secondary | ICD-10-CM | POA: Diagnosis not present

## 2023-03-11 DIAGNOSIS — Z6828 Body mass index (BMI) 28.0-28.9, adult: Secondary | ICD-10-CM | POA: Diagnosis not present

## 2023-03-11 DIAGNOSIS — R030 Elevated blood-pressure reading, without diagnosis of hypertension: Secondary | ICD-10-CM | POA: Diagnosis not present

## 2023-03-11 DIAGNOSIS — F17200 Nicotine dependence, unspecified, uncomplicated: Secondary | ICD-10-CM | POA: Diagnosis not present

## 2023-03-11 DIAGNOSIS — J40 Bronchitis, not specified as acute or chronic: Secondary | ICD-10-CM | POA: Diagnosis not present

## 2023-04-01 DIAGNOSIS — M5412 Radiculopathy, cervical region: Secondary | ICD-10-CM | POA: Diagnosis not present

## 2023-04-01 DIAGNOSIS — I10 Essential (primary) hypertension: Secondary | ICD-10-CM | POA: Diagnosis not present

## 2023-04-01 DIAGNOSIS — E7801 Familial hypercholesterolemia: Secondary | ICD-10-CM | POA: Diagnosis not present

## 2023-04-01 DIAGNOSIS — Z6830 Body mass index (BMI) 30.0-30.9, adult: Secondary | ICD-10-CM | POA: Diagnosis not present

## 2023-04-01 DIAGNOSIS — F17200 Nicotine dependence, unspecified, uncomplicated: Secondary | ICD-10-CM | POA: Diagnosis not present

## 2023-04-01 DIAGNOSIS — E1165 Type 2 diabetes mellitus with hyperglycemia: Secondary | ICD-10-CM | POA: Diagnosis not present

## 2023-04-01 DIAGNOSIS — S39012A Strain of muscle, fascia and tendon of lower back, initial encounter: Secondary | ICD-10-CM | POA: Diagnosis not present

## 2023-04-01 DIAGNOSIS — Z23 Encounter for immunization: Secondary | ICD-10-CM | POA: Diagnosis not present

## 2023-05-04 DIAGNOSIS — Z6829 Body mass index (BMI) 29.0-29.9, adult: Secondary | ICD-10-CM | POA: Diagnosis not present

## 2023-05-04 DIAGNOSIS — M25541 Pain in joints of right hand: Secondary | ICD-10-CM | POA: Diagnosis not present

## 2023-05-04 DIAGNOSIS — M25542 Pain in joints of left hand: Secondary | ICD-10-CM | POA: Diagnosis not present

## 2023-05-15 DIAGNOSIS — M549 Dorsalgia, unspecified: Secondary | ICD-10-CM | POA: Diagnosis not present

## 2023-05-15 DIAGNOSIS — M542 Cervicalgia: Secondary | ICD-10-CM | POA: Diagnosis not present

## 2023-05-15 DIAGNOSIS — M7989 Other specified soft tissue disorders: Secondary | ICD-10-CM | POA: Diagnosis not present

## 2023-05-15 DIAGNOSIS — M79641 Pain in right hand: Secondary | ICD-10-CM | POA: Diagnosis not present

## 2023-05-15 DIAGNOSIS — M79643 Pain in unspecified hand: Secondary | ICD-10-CM | POA: Diagnosis not present

## 2023-05-15 DIAGNOSIS — M1990 Unspecified osteoarthritis, unspecified site: Secondary | ICD-10-CM | POA: Diagnosis not present

## 2023-05-15 DIAGNOSIS — M2550 Pain in unspecified joint: Secondary | ICD-10-CM | POA: Diagnosis not present

## 2023-05-15 DIAGNOSIS — R202 Paresthesia of skin: Secondary | ICD-10-CM | POA: Diagnosis not present

## 2023-05-15 DIAGNOSIS — M79642 Pain in left hand: Secondary | ICD-10-CM | POA: Diagnosis not present

## 2023-05-15 DIAGNOSIS — M4802 Spinal stenosis, cervical region: Secondary | ICD-10-CM | POA: Diagnosis not present

## 2023-06-02 DIAGNOSIS — Z79899 Other long term (current) drug therapy: Secondary | ICD-10-CM | POA: Diagnosis not present

## 2023-06-02 DIAGNOSIS — M549 Dorsalgia, unspecified: Secondary | ICD-10-CM | POA: Diagnosis not present

## 2023-06-02 DIAGNOSIS — M0579 Rheumatoid arthritis with rheumatoid factor of multiple sites without organ or systems involvement: Secondary | ICD-10-CM | POA: Diagnosis not present

## 2023-06-02 DIAGNOSIS — E790 Hyperuricemia without signs of inflammatory arthritis and tophaceous disease: Secondary | ICD-10-CM | POA: Diagnosis not present

## 2023-06-02 DIAGNOSIS — M4690 Unspecified inflammatory spondylopathy, site unspecified: Secondary | ICD-10-CM | POA: Diagnosis not present

## 2023-06-02 DIAGNOSIS — M79643 Pain in unspecified hand: Secondary | ICD-10-CM | POA: Diagnosis not present

## 2023-06-02 DIAGNOSIS — M1990 Unspecified osteoarthritis, unspecified site: Secondary | ICD-10-CM | POA: Diagnosis not present

## 2023-06-02 DIAGNOSIS — M542 Cervicalgia: Secondary | ICD-10-CM | POA: Diagnosis not present

## 2023-06-02 DIAGNOSIS — G5600 Carpal tunnel syndrome, unspecified upper limb: Secondary | ICD-10-CM | POA: Diagnosis not present

## 2023-07-20 ENCOUNTER — Other Ambulatory Visit (HOSPITAL_COMMUNITY): Payer: Self-pay | Admitting: Family Medicine

## 2023-07-20 DIAGNOSIS — F17200 Nicotine dependence, unspecified, uncomplicated: Secondary | ICD-10-CM

## 2023-07-21 DIAGNOSIS — M0579 Rheumatoid arthritis with rheumatoid factor of multiple sites without organ or systems involvement: Secondary | ICD-10-CM | POA: Diagnosis not present

## 2023-07-21 DIAGNOSIS — M79643 Pain in unspecified hand: Secondary | ICD-10-CM | POA: Diagnosis not present

## 2023-07-21 DIAGNOSIS — M549 Dorsalgia, unspecified: Secondary | ICD-10-CM | POA: Diagnosis not present

## 2023-07-21 DIAGNOSIS — M542 Cervicalgia: Secondary | ICD-10-CM | POA: Diagnosis not present

## 2023-07-21 DIAGNOSIS — Z79899 Other long term (current) drug therapy: Secondary | ICD-10-CM | POA: Diagnosis not present

## 2023-07-21 DIAGNOSIS — M1990 Unspecified osteoarthritis, unspecified site: Secondary | ICD-10-CM | POA: Diagnosis not present

## 2023-07-21 DIAGNOSIS — G5600 Carpal tunnel syndrome, unspecified upper limb: Secondary | ICD-10-CM | POA: Diagnosis not present

## 2023-07-21 DIAGNOSIS — M4690 Unspecified inflammatory spondylopathy, site unspecified: Secondary | ICD-10-CM | POA: Diagnosis not present

## 2023-07-21 DIAGNOSIS — E790 Hyperuricemia without signs of inflammatory arthritis and tophaceous disease: Secondary | ICD-10-CM | POA: Diagnosis not present

## 2023-08-10 ENCOUNTER — Other Ambulatory Visit (HOSPITAL_COMMUNITY): Payer: Self-pay | Admitting: Family Medicine

## 2023-08-10 DIAGNOSIS — F17200 Nicotine dependence, unspecified, uncomplicated: Secondary | ICD-10-CM

## 2023-08-20 DIAGNOSIS — M545 Low back pain, unspecified: Secondary | ICD-10-CM | POA: Diagnosis not present

## 2023-08-20 DIAGNOSIS — Z6830 Body mass index (BMI) 30.0-30.9, adult: Secondary | ICD-10-CM | POA: Diagnosis not present

## 2023-08-20 DIAGNOSIS — S39012A Strain of muscle, fascia and tendon of lower back, initial encounter: Secondary | ICD-10-CM | POA: Diagnosis not present

## 2023-10-07 ENCOUNTER — Encounter: Payer: Self-pay | Admitting: Podiatry

## 2023-10-07 ENCOUNTER — Ambulatory Visit (INDEPENDENT_AMBULATORY_CARE_PROVIDER_SITE_OTHER): Admitting: Podiatry

## 2023-10-07 VITALS — BP 128/86 | HR 76 | Temp 97.2°F | Resp 18 | Ht 71.0 in | Wt 218.0 lb

## 2023-10-07 DIAGNOSIS — M2042 Other hammer toe(s) (acquired), left foot: Secondary | ICD-10-CM | POA: Diagnosis not present

## 2023-10-07 DIAGNOSIS — L84 Corns and callosities: Secondary | ICD-10-CM | POA: Diagnosis not present

## 2023-10-08 NOTE — Progress Notes (Signed)
 Subjective:   Patient ID: Franklin Pittman, male   DOB: 68 y.o.   MRN: [id number]   HPI Patient presents with a painful fourth digit left and not sure whether it is the nail or the toe or a lesion but it is very hard to walk.  Patient does  smoke half pack per day and tries to be active   Review of Systems  All other systems reviewed and are negative.       Objective:  Physical Exam Vitals and nursing note reviewed.  Constitutional:      Appearance: He is well-developed.  Pulmonary:     Effort: Pulmonary effort is normal.  Musculoskeletal:        General: Normal range of motion.  Skin:    General: Skin is warm.  Neurological:     Mental Status: He is alert.     Neurovascular status was found to be intact muscle strength was found to be adequate range of motion adequate with patient noted to have a structurally abnormal fourth digit left with a distal keratotic lesion that is painful when pressed and make shoe gear difficult.  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  I did courtesy debridement of the lesion no iatrogenic bleeding I applied cushioning to the toe I discussed arthroplasty educated him on distal arthroplasty which may be necessary.  Reappoint as symptoms indicate and hopefully what I did today will give him good relief for extended period of time

## 2023-12-07 DIAGNOSIS — M549 Dorsalgia, unspecified: Secondary | ICD-10-CM | POA: Diagnosis not present

## 2023-12-07 DIAGNOSIS — M79643 Pain in unspecified hand: Secondary | ICD-10-CM | POA: Diagnosis not present

## 2023-12-07 DIAGNOSIS — M1990 Unspecified osteoarthritis, unspecified site: Secondary | ICD-10-CM | POA: Diagnosis not present

## 2023-12-07 DIAGNOSIS — E790 Hyperuricemia without signs of inflammatory arthritis and tophaceous disease: Secondary | ICD-10-CM | POA: Diagnosis not present

## 2023-12-07 DIAGNOSIS — M542 Cervicalgia: Secondary | ICD-10-CM | POA: Diagnosis not present

## 2023-12-07 DIAGNOSIS — M4690 Unspecified inflammatory spondylopathy, site unspecified: Secondary | ICD-10-CM | POA: Diagnosis not present

## 2023-12-07 DIAGNOSIS — G5600 Carpal tunnel syndrome, unspecified upper limb: Secondary | ICD-10-CM | POA: Diagnosis not present

## 2023-12-07 DIAGNOSIS — Z79899 Other long term (current) drug therapy: Secondary | ICD-10-CM | POA: Diagnosis not present

## 2023-12-07 DIAGNOSIS — M0579 Rheumatoid arthritis with rheumatoid factor of multiple sites without organ or systems involvement: Secondary | ICD-10-CM | POA: Diagnosis not present

## 2023-12-09 DIAGNOSIS — M25552 Pain in left hip: Secondary | ICD-10-CM | POA: Diagnosis not present

## 2023-12-09 DIAGNOSIS — Z6830 Body mass index (BMI) 30.0-30.9, adult: Secondary | ICD-10-CM | POA: Diagnosis not present

## 2023-12-09 DIAGNOSIS — M25551 Pain in right hip: Secondary | ICD-10-CM | POA: Diagnosis not present

## 2023-12-09 DIAGNOSIS — F17210 Nicotine dependence, cigarettes, uncomplicated: Secondary | ICD-10-CM | POA: Diagnosis not present

## 2023-12-09 DIAGNOSIS — M25521 Pain in right elbow: Secondary | ICD-10-CM | POA: Diagnosis not present

## 2024-02-15 ENCOUNTER — Ambulatory Visit: Admitting: Podiatry

## 2024-02-22 ENCOUNTER — Ambulatory Visit: Admitting: Podiatry

## 2024-02-22 ENCOUNTER — Encounter: Payer: Self-pay | Admitting: Podiatry

## 2024-02-22 DIAGNOSIS — L600 Ingrowing nail: Secondary | ICD-10-CM | POA: Diagnosis not present

## 2024-02-24 NOTE — Progress Notes (Signed)
 Subjective:   Patient ID: Franklin Pittman, male   DOB: 68 y.o.   MRN: [id number]   HPI Patient presents with severe damage to the fourth nail of left states it is been incurvated sore and hard to wear shoe gear with.  States has tried to trim it and soak it without relief   ROS      Objective:  Physical Exam  Neurovascular status intact muscle strength found to be adequate range of motion within normal limits with patient found to have incurvation of the fourth nailbed left that is painful when pressed with inability to wear shoe gear comfortably no active drainage or redness noted     Assessment:  In grown toenail deformity left fourth toe lateral border painful     Plan:  H&P reviewed condition recommended correction explained procedure risk patient wants surgery.  Patient at this time signed consent form for procedure and I went ahead today and infiltrated the left fourth toe 60 mg like Marcaine  mixture exposed the bed corner removed the ingrown toenail and applied chemical phenol 3 applications 30 seconds followed by alcohol  lavage sterile dressing.  Given instructions on soaks wear dressing 24 hours take it off earlier if throbbing were to occur call with any questions which may come up during healing

## 2024-03-02 ENCOUNTER — Telehealth: Payer: Self-pay

## 2024-03-02 NOTE — Telephone Encounter (Signed)
 Patient called and left a message - his toe is still very painful and swells every day - he cannot wear his regular shoes - asking for post op shoe, thinks his toe will do better. [phone]

## 2024-03-03 ENCOUNTER — Ambulatory Visit: Admitting: Podiatry

## 2024-03-03 ENCOUNTER — Ambulatory Visit (INDEPENDENT_AMBULATORY_CARE_PROVIDER_SITE_OTHER)

## 2024-03-03 DIAGNOSIS — L089 Local infection of the skin and subcutaneous tissue, unspecified: Secondary | ICD-10-CM

## 2024-03-03 MED ORDER — DOXYCYCLINE HYCLATE 100 MG PO TABS: 100.0000 mg | ORAL_TABLET | Freq: Two times a day (BID) | ORAL | 1 refills | Status: AC

## 2024-03-04 NOTE — Progress Notes (Signed)
 Subjective:   Patient ID: Franklin Pittman, male   DOB: 68 y.o.   MRN: [id number]   HPI Patient presents after having an ingrown toenail done approximately 10 days ago.  Patient has caregiver with him admits that he did not protected and he is wearing very dirty type shoes and he did not wear any form of bandage on it when out   ROS      Objective:  Physical Exam  Neurovascular status intact fourth digit left is somewhat macerated in the distal portion the area that I do the actual ingrown is crusted over and there is no proximal edema erythema or drainage noted at the current time     Assessment:  Paronychia infection of the left fourth toe secondary to poor postoperative care     Plan:  H&P precautionary x-rays taken and I went ahead today I advised on soaks and I am placing on antibiotic doxycycline.  This should heal uneventfully and strict instructions given if any changes were to occur including redness increased pain swelling drainage he is to let us  know immediately  X-rays indicate that there is no osteolysis or indications of bone infection occurring with this condition

## 2024-03-07 NOTE — Telephone Encounter (Signed)
 That is fine. If not improving I want to see back
